# Patient Record
Sex: Female | Born: 1935 | Race: Black or African American | Hispanic: No | Smoking: Never smoker
Health system: Southern US, Community
[De-identification: ages and names within clinical notes are randomized; demographics above are authoritative.]

## PROBLEM LIST (undated history)

## (undated) DIAGNOSIS — K922 Gastrointestinal hemorrhage, unspecified: Secondary | ICD-10-CM

## (undated) DIAGNOSIS — C50919 Malignant neoplasm of unspecified site of unspecified female breast: Secondary | ICD-10-CM

## (undated) DIAGNOSIS — I10 Essential (primary) hypertension: Secondary | ICD-10-CM

## (undated) DIAGNOSIS — E119 Type 2 diabetes mellitus without complications: Secondary | ICD-10-CM

## (undated) DIAGNOSIS — D649 Anemia, unspecified: Secondary | ICD-10-CM

## (undated) HISTORY — PX: SHOULDER OPEN ROTATOR CUFF REPAIR: SHX2407

## (undated) HISTORY — PX: BREAST LUMPECTOMY: SHX2

---

## 1997-08-06 ENCOUNTER — Ambulatory Visit (HOSPITAL_COMMUNITY): Admission: RE | Admit: 1997-08-06 | Discharge: 1997-08-06 | Payer: Self-pay | Admitting: Hematology and Oncology

## 1998-03-02 ENCOUNTER — Ambulatory Visit (HOSPITAL_COMMUNITY): Admission: RE | Admit: 1998-03-02 | Discharge: 1998-03-02 | Payer: Self-pay | Admitting: Hematology and Oncology

## 1998-10-29 ENCOUNTER — Encounter: Payer: Self-pay | Admitting: Hematology and Oncology

## 1998-10-29 ENCOUNTER — Ambulatory Visit (HOSPITAL_COMMUNITY): Admission: RE | Admit: 1998-10-29 | Discharge: 1998-10-29 | Payer: Self-pay | Admitting: Hematology and Oncology

## 1998-11-02 ENCOUNTER — Ambulatory Visit: Admission: RE | Admit: 1998-11-02 | Discharge: 1998-11-02 | Payer: Self-pay | Admitting: Gynecology

## 1998-12-20 ENCOUNTER — Encounter: Payer: Self-pay | Admitting: Gynecology

## 1998-12-22 ENCOUNTER — Ambulatory Visit (HOSPITAL_COMMUNITY): Admission: RE | Admit: 1998-12-22 | Discharge: 1998-12-22 | Payer: Self-pay | Admitting: Gynecology

## 1998-12-22 ENCOUNTER — Encounter (INDEPENDENT_AMBULATORY_CARE_PROVIDER_SITE_OTHER): Payer: Self-pay | Admitting: Specialist

## 1999-01-25 ENCOUNTER — Ambulatory Visit: Admission: RE | Admit: 1999-01-25 | Discharge: 1999-01-25 | Payer: Self-pay | Admitting: Gynecology

## 1999-05-24 ENCOUNTER — Ambulatory Visit (HOSPITAL_COMMUNITY): Admission: RE | Admit: 1999-05-24 | Discharge: 1999-05-24 | Payer: Self-pay | Admitting: Hematology and Oncology

## 1999-05-24 ENCOUNTER — Encounter: Payer: Self-pay | Admitting: Hematology and Oncology

## 1999-10-19 ENCOUNTER — Encounter: Payer: Self-pay | Admitting: *Deleted

## 1999-10-19 ENCOUNTER — Inpatient Hospital Stay (HOSPITAL_COMMUNITY): Admission: EM | Admit: 1999-10-19 | Discharge: 1999-10-24 | Payer: Self-pay | Admitting: *Deleted

## 1999-10-20 ENCOUNTER — Encounter: Payer: Self-pay | Admitting: *Deleted

## 1999-10-21 ENCOUNTER — Encounter: Payer: Self-pay | Admitting: *Deleted

## 2000-05-31 ENCOUNTER — Encounter: Payer: Self-pay | Admitting: Oncology

## 2000-05-31 ENCOUNTER — Encounter: Admission: RE | Admit: 2000-05-31 | Discharge: 2000-05-31 | Payer: Self-pay | Admitting: Oncology

## 2000-11-27 ENCOUNTER — Other Ambulatory Visit: Admission: RE | Admit: 2000-11-27 | Discharge: 2000-11-27 | Payer: Self-pay | Admitting: *Deleted

## 2000-12-04 ENCOUNTER — Encounter: Admission: RE | Admit: 2000-12-04 | Discharge: 2000-12-04 | Payer: Self-pay | Admitting: Oncology

## 2000-12-04 ENCOUNTER — Encounter: Payer: Self-pay | Admitting: Oncology

## 2000-12-28 ENCOUNTER — Ambulatory Visit (HOSPITAL_COMMUNITY): Admission: RE | Admit: 2000-12-28 | Discharge: 2000-12-28 | Payer: Self-pay | Admitting: *Deleted

## 2001-07-04 ENCOUNTER — Encounter: Payer: Self-pay | Admitting: Oncology

## 2001-07-04 ENCOUNTER — Encounter: Admission: RE | Admit: 2001-07-04 | Discharge: 2001-07-04 | Payer: Self-pay | Admitting: Oncology

## 2001-10-15 ENCOUNTER — Inpatient Hospital Stay (HOSPITAL_COMMUNITY): Admission: EM | Admit: 2001-10-15 | Discharge: 2001-10-21 | Payer: Self-pay | Admitting: Emergency Medicine

## 2001-10-15 ENCOUNTER — Encounter: Payer: Self-pay | Admitting: Emergency Medicine

## 2002-07-10 ENCOUNTER — Encounter: Admission: RE | Admit: 2002-07-10 | Discharge: 2002-07-10 | Payer: Self-pay | Admitting: Oncology

## 2002-07-10 ENCOUNTER — Encounter: Payer: Self-pay | Admitting: Oncology

## 2003-02-25 ENCOUNTER — Ambulatory Visit (HOSPITAL_COMMUNITY): Admission: RE | Admit: 2003-02-25 | Discharge: 2003-02-25 | Payer: Self-pay | Admitting: Family Medicine

## 2003-02-25 ENCOUNTER — Encounter: Payer: Self-pay | Admitting: Cardiology

## 2003-07-06 ENCOUNTER — Encounter (HOSPITAL_COMMUNITY): Admission: RE | Admit: 2003-07-06 | Discharge: 2003-10-04 | Payer: Self-pay | Admitting: *Deleted

## 2005-11-27 ENCOUNTER — Encounter: Payer: Self-pay | Admitting: Vascular Surgery

## 2005-11-27 ENCOUNTER — Ambulatory Visit (HOSPITAL_COMMUNITY): Admission: RE | Admit: 2005-11-27 | Discharge: 2005-11-27 | Payer: Self-pay | Admitting: Family Medicine

## 2006-03-09 ENCOUNTER — Inpatient Hospital Stay (HOSPITAL_COMMUNITY): Admission: AD | Admit: 2006-03-09 | Discharge: 2006-03-15 | Payer: Self-pay | Admitting: Internal Medicine

## 2006-03-13 ENCOUNTER — Encounter (INDEPENDENT_AMBULATORY_CARE_PROVIDER_SITE_OTHER): Payer: Self-pay | Admitting: Cardiology

## 2006-07-25 ENCOUNTER — Encounter: Admission: RE | Admit: 2006-07-25 | Discharge: 2006-07-25 | Payer: Self-pay | Admitting: Family Medicine

## 2006-08-10 ENCOUNTER — Encounter: Admission: RE | Admit: 2006-08-10 | Discharge: 2006-08-10 | Payer: Self-pay | Admitting: Orthopedic Surgery

## 2006-08-31 ENCOUNTER — Ambulatory Visit (HOSPITAL_COMMUNITY): Admission: RE | Admit: 2006-08-31 | Discharge: 2006-09-01 | Payer: Self-pay | Admitting: Orthopedic Surgery

## 2006-10-12 ENCOUNTER — Encounter: Admission: RE | Admit: 2006-10-12 | Discharge: 2006-12-04 | Payer: Self-pay | Admitting: Orthopedic Surgery

## 2006-11-09 ENCOUNTER — Encounter (HOSPITAL_COMMUNITY): Admission: RE | Admit: 2006-11-09 | Discharge: 2007-01-23 | Payer: Self-pay | Admitting: Gastroenterology

## 2008-07-20 ENCOUNTER — Encounter: Admission: RE | Admit: 2008-07-20 | Discharge: 2008-07-20 | Payer: Self-pay | Admitting: Orthopedic Surgery

## 2008-08-07 ENCOUNTER — Inpatient Hospital Stay (HOSPITAL_COMMUNITY): Admission: EM | Admit: 2008-08-07 | Discharge: 2008-08-09 | Payer: Self-pay | Admitting: Emergency Medicine

## 2008-10-08 ENCOUNTER — Inpatient Hospital Stay (HOSPITAL_COMMUNITY): Admission: RE | Admit: 2008-10-08 | Discharge: 2008-10-13 | Payer: Self-pay | Admitting: Orthopedic Surgery

## 2008-11-23 ENCOUNTER — Ambulatory Visit: Payer: Self-pay | Admitting: Vascular Surgery

## 2008-11-23 ENCOUNTER — Ambulatory Visit (HOSPITAL_COMMUNITY): Admission: RE | Admit: 2008-11-23 | Discharge: 2008-11-23 | Payer: Self-pay | Admitting: Family Medicine

## 2008-11-23 ENCOUNTER — Emergency Department (HOSPITAL_COMMUNITY): Admission: EM | Admit: 2008-11-23 | Discharge: 2008-11-23 | Payer: Self-pay | Admitting: Family Medicine

## 2008-11-23 ENCOUNTER — Encounter (HOSPITAL_COMMUNITY): Payer: Self-pay | Admitting: Family Medicine

## 2008-12-03 ENCOUNTER — Encounter: Admission: RE | Admit: 2008-12-03 | Discharge: 2009-01-20 | Payer: Self-pay | Admitting: Orthopedic Surgery

## 2009-05-22 HISTORY — PX: CHOLECYSTECTOMY: SHX55

## 2009-08-31 ENCOUNTER — Encounter (HOSPITAL_COMMUNITY): Admission: RE | Admit: 2009-08-31 | Discharge: 2009-11-29 | Payer: Self-pay | Admitting: Nephrology

## 2009-12-21 ENCOUNTER — Ambulatory Visit: Payer: Self-pay | Admitting: Oncology

## 2009-12-23 LAB — CBC WITH DIFFERENTIAL/PLATELET
Eosinophils Absolute: 0.1 10*3/uL (ref 0.0–0.5)
HGB: 12.1 g/dL (ref 11.6–15.9)
LYMPH%: 16.4 % (ref 14.0–49.7)
MONO#: 0.4 10*3/uL (ref 0.1–0.9)
NEUT#: 3.1 10*3/uL (ref 1.5–6.5)
Platelets: 629 10*3/uL — ABNORMAL HIGH (ref 145–400)
RBC: 4.88 10*6/uL (ref 3.70–5.45)
RDW: 19.2 % — ABNORMAL HIGH (ref 11.2–14.5)
WBC: 4.3 10*3/uL (ref 3.9–10.3)
nRBC: 0 % (ref 0–0)

## 2009-12-23 LAB — COMPREHENSIVE METABOLIC PANEL
ALT: <8 U/L (ref 0–35)
CO2: 29 mEq/L (ref 19–32)
Calcium: 9.8 mg/dL (ref 8.4–10.5)
Chloride: 100 mEq/L (ref 96–112)
Sodium: 140 mEq/L (ref 135–145)
Total Protein: 7.2 g/dL (ref 6.0–8.3)

## 2009-12-23 LAB — FERRITIN: Ferritin: 49 ng/mL (ref 10–291)

## 2010-03-01 ENCOUNTER — Ambulatory Visit: Payer: Self-pay | Admitting: Oncology

## 2010-06-01 ENCOUNTER — Ambulatory Visit: Payer: Self-pay | Admitting: Oncology

## 2010-06-03 LAB — CBC WITH DIFFERENTIAL/PLATELET
BASO%: 1 % (ref 0.0–2.0)
Basophils Absolute: 0.1 10*3/uL (ref 0.0–0.1)
EOS%: 2.5 % (ref 0.0–7.0)
Eosinophils Absolute: 0.2 10*3/uL (ref 0.0–0.5)
HCT: 36.6 % (ref 34.8–46.6)
HGB: 11.9 g/dL (ref 11.6–15.9)
LYMPH%: 10.7 % — ABNORMAL LOW (ref 14.0–49.7)
MCH: 25.7 pg (ref 25.1–34.0)
MCHC: 32.5 g/dL (ref 31.5–36.0)
MCV: 79.1 fL — ABNORMAL LOW (ref 79.5–101.0)
MONO#: 0.5 10*3/uL (ref 0.1–0.9)
MONO%: 7.3 % (ref 0.0–14.0)
NEUT#: 5.1 10*3/uL (ref 1.5–6.5)
NEUT%: 78.5 % — ABNORMAL HIGH (ref 38.4–76.8)
Platelets: 917 10*3/uL — ABNORMAL HIGH (ref 145–400)
RBC: 4.63 10*6/uL (ref 3.70–5.45)
RDW: 15.1 % — ABNORMAL HIGH (ref 11.2–14.5)
WBC: 6.5 10*3/uL (ref 3.9–10.3)
lymph#: 0.7 10*3/uL — ABNORMAL LOW (ref 0.9–3.3)

## 2010-06-03 LAB — IRON AND TIBC
%SAT: 8 % — ABNORMAL LOW (ref 20–55)
Iron: 23 ug/dL — ABNORMAL LOW (ref 42–145)
TIBC: 298 ug/dL (ref 250–470)
UIBC: 275 ug/dL

## 2010-06-03 LAB — FERRITIN: Ferritin: 26 ng/mL (ref 10–291)

## 2010-06-17 LAB — CBC WITH DIFFERENTIAL/PLATELET
Basophils Absolute: 0.1 10*3/uL (ref 0.0–0.1)
Eosinophils Absolute: 0.1 10*3/uL (ref 0.0–0.5)
HCT: 36.8 % (ref 34.8–46.6)
HGB: 11.9 g/dL (ref 11.6–15.9)
MONO#: 0.3 10*3/uL (ref 0.1–0.9)
NEUT#: 3.7 10*3/uL (ref 1.5–6.5)
NEUT%: 72.4 % (ref 38.4–76.8)
RDW: 16.4 % — ABNORMAL HIGH (ref 11.2–14.5)
WBC: 5.1 10*3/uL (ref 3.9–10.3)
lymph#: 1 10*3/uL (ref 0.9–3.3)

## 2010-06-25 ENCOUNTER — Emergency Department (HOSPITAL_COMMUNITY)
Admission: EM | Admit: 2010-06-25 | Discharge: 2010-06-25 | Disposition: A | Discharge status: Other Acute Inpatient Hospital | Payer: PRIVATE HEALTH INSURANCE | Attending: Emergency Medicine | Admitting: Emergency Medicine

## 2010-06-25 ENCOUNTER — Emergency Department (HOSPITAL_COMMUNITY): Payer: PRIVATE HEALTH INSURANCE

## 2010-06-25 DIAGNOSIS — R000 Tachycardia, unspecified: Secondary | ICD-10-CM | POA: Insufficient documentation

## 2010-06-25 DIAGNOSIS — N19 Unspecified kidney failure: Secondary | ICD-10-CM | POA: Insufficient documentation

## 2010-06-25 DIAGNOSIS — Z853 Personal history of malignant neoplasm of breast: Secondary | ICD-10-CM | POA: Insufficient documentation

## 2010-06-25 DIAGNOSIS — I10 Essential (primary) hypertension: Secondary | ICD-10-CM | POA: Insufficient documentation

## 2010-06-25 DIAGNOSIS — E860 Dehydration: Secondary | ICD-10-CM | POA: Insufficient documentation

## 2010-06-25 LAB — POCT I-STAT, CHEM 8
BUN: 39 mg/dL — ABNORMAL HIGH (ref 6–23)
Creatinine, Ser: 3.7 mg/dL — ABNORMAL HIGH (ref 0.4–1.2)
Glucose, Bld: 157 mg/dL — ABNORMAL HIGH (ref 70–99)
Hemoglobin: 12.6 g/dL (ref 12.0–15.0)
Potassium: 3.4 mEq/L — ABNORMAL LOW (ref 3.5–5.1)
Sodium: 132 mEq/L — ABNORMAL LOW (ref 135–145)
TCO2: 25 mmol/L (ref 0–100)

## 2010-06-25 LAB — DIFFERENTIAL
Basophils Relative: 0 % (ref 0–1)
Lymphocytes Relative: 3 % — ABNORMAL LOW (ref 12–46)
Monocytes Absolute: 0.4 10*3/uL (ref 0.1–1.0)
Monocytes Relative: 4 % (ref 3–12)
Neutro Abs: 8.3 10*3/uL — ABNORMAL HIGH (ref 1.7–7.7)
Neutrophils Relative %: 93 % — ABNORMAL HIGH (ref 43–77)

## 2010-06-25 LAB — CBC
HCT: 34.9 % — ABNORMAL LOW (ref 36.0–46.0)
Hemoglobin: 11.9 g/dL — ABNORMAL LOW (ref 12.0–15.0)
MCH: 26.3 pg (ref 26.0–34.0)
MCHC: 34.1 g/dL (ref 30.0–36.0)
RBC: 4.53 MIL/uL (ref 3.87–5.11)

## 2010-06-25 LAB — POCT CARDIAC MARKERS
CKMB, poc: <1 ng/mL — ABNORMAL LOW (ref 1.0–8.0)
Myoglobin, poc: >500 ng/mL (ref 12–200)

## 2010-06-25 LAB — URINALYSIS, ROUTINE W REFLEX MICROSCOPIC
Leukocytes, UA: NEGATIVE
Nitrite: NEGATIVE
Protein, ur: NEGATIVE mg/dL
Specific Gravity, Urine: 1.015 (ref 1.005–1.030)
Urobilinogen, UA: 0.2 mg/dL (ref 0.0–1.0)

## 2010-06-25 LAB — URINE MICROSCOPIC-ADD ON

## 2010-06-25 LAB — HEPATIC FUNCTION PANEL
Bilirubin, Direct: 0.1 mg/dL (ref 0.0–0.3)
Indirect Bilirubin: 0.6 mg/dL (ref 0.3–0.9)
Total Bilirubin: 0.7 mg/dL (ref 0.3–1.2)

## 2010-06-25 LAB — CK TOTAL AND CKMB (NOT AT ARMC)
CK, MB: 1.3 ng/mL (ref 0.3–4.0)
Total CK: 194 U/L — ABNORMAL HIGH (ref 7–177)

## 2010-06-25 LAB — LIPASE, BLOOD: Lipase: 64 U/L — ABNORMAL HIGH (ref 11–59)

## 2010-06-26 ENCOUNTER — Inpatient Hospital Stay (HOSPITAL_COMMUNITY): Payer: PRIVATE HEALTH INSURANCE

## 2010-06-26 ENCOUNTER — Other Ambulatory Visit: Payer: Self-pay | Admitting: Internal Medicine

## 2010-06-26 ENCOUNTER — Inpatient Hospital Stay (HOSPITAL_COMMUNITY)
Admission: EM | Admit: 2010-06-26 | Discharge: 2010-06-28 | DRG: 392 | Disposition: A | Discharge status: Nursing Home (Includes ICF) | Payer: PRIVATE HEALTH INSURANCE | Source: Ambulatory Visit | Attending: Internal Medicine | Admitting: Internal Medicine

## 2010-06-26 DIAGNOSIS — K8020 Calculus of gallbladder without cholecystitis without obstruction: Secondary | ICD-10-CM | POA: Diagnosis present

## 2010-06-26 DIAGNOSIS — K560 Paralytic ileus: Secondary | ICD-10-CM | POA: Diagnosis not present

## 2010-06-26 DIAGNOSIS — I10 Essential (primary) hypertension: Secondary | ICD-10-CM | POA: Diagnosis present

## 2010-06-26 DIAGNOSIS — E860 Dehydration: Secondary | ICD-10-CM | POA: Diagnosis present

## 2010-06-26 DIAGNOSIS — E119 Type 2 diabetes mellitus without complications: Secondary | ICD-10-CM | POA: Diagnosis present

## 2010-06-26 DIAGNOSIS — A09 Infectious gastroenteritis and colitis, unspecified: Principal | ICD-10-CM | POA: Diagnosis present

## 2010-06-26 DIAGNOSIS — N179 Acute kidney failure, unspecified: Secondary | ICD-10-CM | POA: Diagnosis present

## 2010-06-26 LAB — DIFFERENTIAL
Lymphs Abs: 0.2 10*3/uL — ABNORMAL LOW (ref 0.7–4.0)
Monocytes Relative: 6 % (ref 3–12)
Neutro Abs: 4.9 10*3/uL (ref 1.7–7.7)
Neutrophils Relative %: 90 % — ABNORMAL HIGH (ref 43–77)

## 2010-06-26 LAB — COMPREHENSIVE METABOLIC PANEL
ALT: 13 U/L (ref 0–35)
Alkaline Phosphatase: 35 U/L — ABNORMAL LOW (ref 39–117)
CO2: 24 mEq/L (ref 19–32)
GFR calc non Af Amer: 22 mL/min — ABNORMAL LOW (ref >60)
Glucose, Bld: 92 mg/dL (ref 70–99)
Potassium: 3.7 mEq/L (ref 3.5–5.1)
Sodium: 138 mEq/L (ref 135–145)
Total Protein: 6 g/dL (ref 6.0–8.3)

## 2010-06-26 LAB — CBC
Hemoglobin: 10.3 g/dL — ABNORMAL LOW (ref 12.0–15.0)
MCH: 25.1 pg — ABNORMAL LOW (ref 26.0–34.0)
MCV: 79 fL (ref 78.0–100.0)
RBC: 4.1 MIL/uL (ref 3.87–5.11)

## 2010-06-26 LAB — LIPASE, BLOOD: Lipase: 74 U/L — ABNORMAL HIGH (ref 11–59)

## 2010-06-26 LAB — HEMOGLOBIN A1C: Mean Plasma Glucose: 114 mg/dL (ref <117)

## 2010-06-26 LAB — GLUCOSE, CAPILLARY: Glucose-Capillary: 155 mg/dL — ABNORMAL HIGH (ref 70–99)

## 2010-06-27 LAB — BASIC METABOLIC PANEL
BUN: 28 mg/dL — ABNORMAL HIGH (ref 6–23)
Calcium: 8.2 mg/dL — ABNORMAL LOW (ref 8.4–10.5)
Creatinine, Ser: 1.58 mg/dL — ABNORMAL HIGH (ref 0.4–1.2)
GFR calc non Af Amer: 31 mL/min — ABNORMAL LOW (ref >60)
Potassium: 3.8 mEq/L (ref 3.5–5.1)

## 2010-06-27 LAB — GLUCOSE, CAPILLARY
Glucose-Capillary: 105 mg/dL — ABNORMAL HIGH (ref 70–99)
Glucose-Capillary: 62 mg/dL — ABNORMAL LOW (ref 70–99)

## 2010-06-27 LAB — SODIUM, URINE, RANDOM: Sodium, Ur: 46 mEq/L

## 2010-06-27 LAB — CREATININE, URINE, RANDOM: Creatinine, Urine: 97 mg/dL

## 2010-06-28 LAB — BASIC METABOLIC PANEL
Calcium: 8.5 mg/dL (ref 8.4–10.5)
GFR calc non Af Amer: 41 mL/min — ABNORMAL LOW (ref >60)
Glucose, Bld: 96 mg/dL (ref 70–99)
Sodium: 140 mEq/L (ref 135–145)

## 2010-06-28 LAB — GLUCOSE, CAPILLARY: Glucose-Capillary: 95 mg/dL (ref 70–99)

## 2010-06-29 ENCOUNTER — Emergency Department (HOSPITAL_COMMUNITY)
Admission: EM | Admit: 2010-06-29 | Discharge: 2010-06-29 | Disposition: A | Discharge status: Home / Self Care | Payer: PRIVATE HEALTH INSURANCE | Attending: Emergency Medicine | Admitting: Emergency Medicine

## 2010-06-29 ENCOUNTER — Emergency Department (HOSPITAL_COMMUNITY): Payer: PRIVATE HEALTH INSURANCE

## 2010-06-29 DIAGNOSIS — I10 Essential (primary) hypertension: Secondary | ICD-10-CM | POA: Insufficient documentation

## 2010-06-29 DIAGNOSIS — E119 Type 2 diabetes mellitus without complications: Secondary | ICD-10-CM | POA: Insufficient documentation

## 2010-06-29 DIAGNOSIS — L02619 Cutaneous abscess of unspecified foot: Secondary | ICD-10-CM | POA: Insufficient documentation

## 2010-06-29 DIAGNOSIS — M79609 Pain in unspecified limb: Secondary | ICD-10-CM | POA: Insufficient documentation

## 2010-06-29 LAB — GLUCOSE, CAPILLARY: Glucose-Capillary: 93 mg/dL (ref 70–99)

## 2010-07-02 LAB — CULTURE, BLOOD (ROUTINE X 2)
Culture  Setup Time: 201202051850
Culture  Setup Time: 201202051850
Culture: NO GROWTH

## 2010-07-03 NOTE — Discharge Summary (Signed)
[name], [name]            ACCOUNT NO.:  [id number]  MEDICAL RECORD NO.:  [id number]           PATIENT TYPE:  LOCATION:                                 FACILITY:  PHYSICIAN:  [name], MDDATE OF BIRTH:  02/11/1921  DATE OF ADMISSION: DATE OF DISCHARGE:                        DISCHARGE SUMMARY - REFERRING   PRIMARY CARE PHYSICIAN:  Monique Rakers, MD  CHIEF COMPLAINT:  Nausea, vomiting, fever, and diarrhea.  DISCHARGE DIAGNOSES: 1. Acute diarrhea, possibly of infectious origin - with mucus and     semisolid in nature with smell - currently resolved. 2. Nausea and vomiting, resolved. 3. Acute kidney injury, possibly secondary to:     a.     Aggressive diuretic therapy.     b.     Intake of ARB.     c.     Dehydration with addition of aggressive diuretic therapy and      intake of ARB leading to acute renal failure - currently      resolving. 4. Diabetes mellitus.  Blood sugar is very stable during hospital     stay. 5. Previous history of breast cancer 10 years ago. 6. Hypertension.  DISCHARGE MEDICATIONS: 1. Ciprofloxacin 250 mg p.o. b.i.d. for 4 more days. 2. Januvia 50 mg p.o. daily, has dose changed from 100-50. 3. Over-the-counter iron tablets 1 tablet p.o. daily.  The following medication has been discontinued; Lasix, Benicar, and hydroxyurea.  The prescription for hydroxyurea is pretty questionable and needs to be reverified with pharmacy.  This information was obtained from the patient's pharmacy.  HOSPITAL COURSE: 1. GI - the patient came in with significant weakness and fever     associated with nausea and vomiting.  On evaluation and family     history taken, the patient was found to have diarrhea, which was     not watery, but semisolid, but had a foul smell.  She was started     empirically on ciprofloxacin after that.  She did not have any     significant diarrhea after that.  She does not give any history of     p.o. antibiotic intake  prior to this and so a C. diff testing is     not warranted.  Also, the diarrhea has resolved without any other     antibiotic other than ciprofloxacin, which was started with a     tentative thought of possible bacterial diarrhea.  Since she     responded very well to that, we would continue that for another 4     days and discontinue the antibiotics. 2. Renal.  The patient was on Lasix and on Benicar at the time of     admission and she was very dehydrated.  The fractional excretion of     sodium was 0.6 - less than 1.0, which indicates significant     prerenal origin of the issue.  Her baseline creatinine is around     1.1 to 1.2.  On the time of admission, her BUN was 39 and     creatinine was 3.7.  She was aggressively hydrated and currently     the  last BUN and creatinine is 17/1.25, which is almost close to     baseline.  Once she has reached the baseline and if her blood     pressures are still on the higher side, I would restart the     Benicar.  Currently, her blood pressures are pretty well within     normal limits to ranging from 125 to 107 systolic and diastolic     ranging from 78-55 and heart rate is also well controlled.     Considering the fact that she is a diabetic, an ARB is decided but     the reduction in the dose of the Benicar will be very much needed     considering the fact that her blood pressure is pretty within     normal limits at this time even without medications. 3. Diabetes mellitus.  Her blood sugars are pretty stable, initially     shows n.p.o. and at that time her blood sugars dropped.  She was on     100 mg of Januvia, which had changed to 50 mg at this time.  She     did have 1 or 2 episodes of low blood sugars because she was n.p.o.     Currently, she is back to her regular diet and her sugars have been     stable.  The last set of sugars we have from are about 95, 120, 77,     and 103.  This was when the patient is not on any antidiabetic      medications.  DISPOSITION:  The patient is discharged back home to an assisted senior living facility.  A followup with Dr. Renaye [name], has been arranged for July 01, 2010, at 10:45 a.m.  The patient needs to have a BMP/BMET at the same time and the results will be followed by her primary physician.  As noted above, the last BUN and creatinine is 17/1.25.  INSTRUCTIONS: 1. The patient has been educated about adequate hydration, but not to     overdo it. 2. Diabetic diet advised. 3. Fall precautions advised.  A total of 45 minutes spent on the discharge process.     Monique Massed, MD     UT/MEDQ  D:  06/28/2010  T:  06/28/2010  Job:  [id number]  cc:   [name], M.D.  Electronically Signed by Monique Massed MD on 07/03/2010 09:10:25 AM

## 2010-07-04 ENCOUNTER — Emergency Department (HOSPITAL_COMMUNITY): Payer: PRIVATE HEALTH INSURANCE

## 2010-07-04 ENCOUNTER — Inpatient Hospital Stay (HOSPITAL_COMMUNITY)
Admission: EM | Admit: 2010-07-04 | Discharge: 2010-07-14 | DRG: 417 | Disposition: A | Discharge status: Skilled Nursing Facility | Payer: PRIVATE HEALTH INSURANCE | Attending: Internal Medicine | Admitting: Internal Medicine

## 2010-07-04 DIAGNOSIS — Z794 Long term (current) use of insulin: Secondary | ICD-10-CM

## 2010-07-04 DIAGNOSIS — D62 Acute posthemorrhagic anemia: Secondary | ICD-10-CM | POA: Diagnosis present

## 2010-07-04 DIAGNOSIS — N179 Acute kidney failure, unspecified: Secondary | ICD-10-CM | POA: Diagnosis present

## 2010-07-04 DIAGNOSIS — Z853 Personal history of malignant neoplasm of breast: Secondary | ICD-10-CM

## 2010-07-04 DIAGNOSIS — I10 Essential (primary) hypertension: Secondary | ICD-10-CM | POA: Diagnosis present

## 2010-07-04 DIAGNOSIS — E785 Hyperlipidemia, unspecified: Secondary | ICD-10-CM | POA: Diagnosis present

## 2010-07-04 DIAGNOSIS — E119 Type 2 diabetes mellitus without complications: Secondary | ICD-10-CM | POA: Diagnosis present

## 2010-07-04 DIAGNOSIS — Q391 Atresia of esophagus with tracheo-esophageal fistula: Secondary | ICD-10-CM

## 2010-07-04 DIAGNOSIS — K31819 Angiodysplasia of stomach and duodenum without bleeding: Secondary | ICD-10-CM | POA: Diagnosis present

## 2010-07-04 DIAGNOSIS — J189 Pneumonia, unspecified organism: Secondary | ICD-10-CM | POA: Diagnosis not present

## 2010-07-04 DIAGNOSIS — Z87891 Personal history of nicotine dependence: Secondary | ICD-10-CM

## 2010-07-04 DIAGNOSIS — K219 Gastro-esophageal reflux disease without esophagitis: Secondary | ICD-10-CM | POA: Diagnosis present

## 2010-07-04 DIAGNOSIS — R5381 Other malaise: Secondary | ICD-10-CM | POA: Diagnosis not present

## 2010-07-04 DIAGNOSIS — K859 Acute pancreatitis without necrosis or infection, unspecified: Principal | ICD-10-CM | POA: Diagnosis present

## 2010-07-04 DIAGNOSIS — Z7982 Long term (current) use of aspirin: Secondary | ICD-10-CM

## 2010-07-04 DIAGNOSIS — K921 Melena: Secondary | ICD-10-CM | POA: Diagnosis present

## 2010-07-04 DIAGNOSIS — K8010 Calculus of gallbladder with chronic cholecystitis without obstruction: Secondary | ICD-10-CM | POA: Diagnosis present

## 2010-07-04 DIAGNOSIS — Z96659 Presence of unspecified artificial knee joint: Secondary | ICD-10-CM

## 2010-07-04 DIAGNOSIS — Z9010 Acquired absence of unspecified breast and nipple: Secondary | ICD-10-CM

## 2010-07-04 LAB — DIFFERENTIAL
Basophils Absolute: 0 10*3/uL (ref 0.0–0.1)
Eosinophils Relative: 1 % (ref 0–5)
Lymphocytes Relative: 3 % — ABNORMAL LOW (ref 12–46)
Lymphs Abs: 0.1 10*3/uL — ABNORMAL LOW (ref 0.7–4.0)
Monocytes Absolute: 0.1 10*3/uL (ref 0.1–1.0)

## 2010-07-04 LAB — CBC
HCT: 30.2 % — ABNORMAL LOW (ref 36.0–46.0)
MCHC: 33.8 g/dL (ref 30.0–36.0)
MCV: 74.6 fL — ABNORMAL LOW (ref 78.0–100.0)
RDW: 17.5 % — ABNORMAL HIGH (ref 11.5–15.5)

## 2010-07-04 LAB — BASIC METABOLIC PANEL
BUN: 25 mg/dL — ABNORMAL HIGH (ref 6–23)
Calcium: 8.7 mg/dL (ref 8.4–10.5)
Creatinine, Ser: 1.9 mg/dL — ABNORMAL HIGH (ref 0.4–1.2)
GFR calc non Af Amer: 25 mL/min — ABNORMAL LOW (ref >60)
Glucose, Bld: 121 mg/dL — ABNORMAL HIGH (ref 70–99)

## 2010-07-04 LAB — URINE MICROSCOPIC-ADD ON

## 2010-07-04 LAB — URINALYSIS, ROUTINE W REFLEX MICROSCOPIC
Bilirubin Urine: NEGATIVE
Ketones, ur: NEGATIVE mg/dL
Nitrite: NEGATIVE
Urobilinogen, UA: 1 mg/dL (ref 0.0–1.0)

## 2010-07-04 LAB — HEPATIC FUNCTION PANEL
AST: 74 U/L — ABNORMAL HIGH (ref 0–37)
Albumin: 3 g/dL — ABNORMAL LOW (ref 3.5–5.2)
Alkaline Phosphatase: 125 U/L — ABNORMAL HIGH (ref 39–117)
Total Bilirubin: 0.5 mg/dL (ref 0.3–1.2)

## 2010-07-05 ENCOUNTER — Inpatient Hospital Stay (HOSPITAL_COMMUNITY): Payer: PRIVATE HEALTH INSURANCE

## 2010-07-05 DIAGNOSIS — M79609 Pain in unspecified limb: Secondary | ICD-10-CM

## 2010-07-05 LAB — GLUCOSE, CAPILLARY
Glucose-Capillary: 128 mg/dL — ABNORMAL HIGH (ref 70–99)
Glucose-Capillary: 121 mg/dL — ABNORMAL HIGH (ref 70–99)
Glucose-Capillary: 98 mg/dL (ref 70–99)

## 2010-07-05 LAB — COMPREHENSIVE METABOLIC PANEL
ALT: 30 U/L (ref 0–35)
AST: 50 U/L — ABNORMAL HIGH (ref 0–37)
Alkaline Phosphatase: 108 U/L (ref 39–117)
BUN: 24 mg/dL — ABNORMAL HIGH (ref 6–23)
CO2: 20 mEq/L (ref 19–32)
CO2: 24 mEq/L (ref 19–32)
Chloride: 112 mEq/L (ref 96–112)
Chloride: 105 mEq/L (ref 96–112)
Creatinine, Ser: 1.75 mg/dL — ABNORMAL HIGH (ref 0.4–1.2)
Creatinine, Ser: 2.08 mg/dL — ABNORMAL HIGH (ref 0.4–1.2)
GFR calc Af Amer: 33 mL/min — ABNORMAL LOW (ref >60)
GFR calc non Af Amer: 27 mL/min — ABNORMAL LOW (ref >60)
GFR calc non Af Amer: 23 mL/min — ABNORMAL LOW (ref >60)
Glucose, Bld: 114 mg/dL — ABNORMAL HIGH (ref 70–99)
Total Bilirubin: 1.5 mg/dL — ABNORMAL HIGH (ref 0.3–1.2)
Total Bilirubin: 0.6 mg/dL (ref 0.3–1.2)

## 2010-07-05 LAB — CBC
Hemoglobin: 7.3 g/dL — ABNORMAL LOW (ref 12.0–15.0)
MCH: 24.9 pg — ABNORMAL LOW (ref 26.0–34.0)
RBC: 2.93 MIL/uL — ABNORMAL LOW (ref 3.87–5.11)

## 2010-07-06 LAB — GLUCOSE, CAPILLARY
Glucose-Capillary: 103 mg/dL — ABNORMAL HIGH (ref 70–99)
Glucose-Capillary: 117 mg/dL — ABNORMAL HIGH (ref 70–99)

## 2010-07-06 LAB — COMPREHENSIVE METABOLIC PANEL
AST: 55 U/L — ABNORMAL HIGH (ref 0–37)
BUN: 19 mg/dL (ref 6–23)
CO2: 22 mEq/L (ref 19–32)
Chloride: 106 mEq/L (ref 96–112)
Creatinine, Ser: 1.86 mg/dL — ABNORMAL HIGH (ref 0.4–1.2)
GFR calc Af Amer: 31 mL/min — ABNORMAL LOW (ref >60)
GFR calc non Af Amer: 26 mL/min — ABNORMAL LOW (ref >60)
Glucose, Bld: 91 mg/dL (ref 70–99)
Total Bilirubin: 0.4 mg/dL (ref 0.3–1.2)

## 2010-07-06 LAB — CBC
HCT: 22.9 % — ABNORMAL LOW (ref 36.0–46.0)
Hemoglobin: 7.6 g/dL — ABNORMAL LOW (ref 12.0–15.0)
MCHC: 33.2 g/dL (ref 30.0–36.0)
MCV: 76.6 fL — ABNORMAL LOW (ref 78.0–100.0)
RDW: 18 % — ABNORMAL HIGH (ref 11.5–15.5)

## 2010-07-06 LAB — LIPASE, BLOOD: Lipase: 39 U/L (ref 11–59)

## 2010-07-06 LAB — FOLATE: Folate: 8.8 ng/mL

## 2010-07-06 LAB — IRON AND TIBC: UIBC: 157 ug/dL

## 2010-07-06 LAB — FERRITIN: Ferritin: 461 ng/mL — ABNORMAL HIGH (ref 10–291)

## 2010-07-07 LAB — GLUCOSE, CAPILLARY
Glucose-Capillary: 115 mg/dL — ABNORMAL HIGH (ref 70–99)
Glucose-Capillary: 94 mg/dL (ref 70–99)
Glucose-Capillary: 109 mg/dL — ABNORMAL HIGH (ref 70–99)

## 2010-07-07 LAB — CBC
Hemoglobin: 8.4 g/dL — ABNORMAL LOW (ref 12.0–15.0)
MCH: 24.6 pg — ABNORMAL LOW (ref 26.0–34.0)
MCV: 76.8 fL — ABNORMAL LOW (ref 78.0–100.0)
Platelets: 402 10*3/uL — ABNORMAL HIGH (ref 150–400)
RBC: 3.41 MIL/uL — ABNORMAL LOW (ref 3.87–5.11)

## 2010-07-07 LAB — COMPREHENSIVE METABOLIC PANEL
BUN: 15 mg/dL (ref 6–23)
CO2: 24 mEq/L (ref 19–32)
Chloride: 107 mEq/L (ref 96–112)
Creatinine, Ser: 1.54 mg/dL — ABNORMAL HIGH (ref 0.4–1.2)
GFR calc non Af Amer: 32 mL/min — ABNORMAL LOW (ref >60)
Total Bilirubin: 0.3 mg/dL (ref 0.3–1.2)

## 2010-07-08 LAB — GLUCOSE, CAPILLARY: Glucose-Capillary: 113 mg/dL — ABNORMAL HIGH (ref 70–99)

## 2010-07-08 LAB — HEMOCCULT GUIAC POC 1CARD (OFFICE): Fecal Occult Bld: NEGATIVE

## 2010-07-09 ENCOUNTER — Other Ambulatory Visit: Payer: Self-pay | Admitting: General Surgery

## 2010-07-09 ENCOUNTER — Inpatient Hospital Stay (HOSPITAL_COMMUNITY): Payer: PRIVATE HEALTH INSURANCE

## 2010-07-09 LAB — GLUCOSE, CAPILLARY
Glucose-Capillary: 120 mg/dL — ABNORMAL HIGH (ref 70–99)
Glucose-Capillary: 118 mg/dL — ABNORMAL HIGH (ref 70–99)
Glucose-Capillary: 138 mg/dL — ABNORMAL HIGH (ref 70–99)

## 2010-07-09 LAB — CBC
HCT: 26.6 % — ABNORMAL LOW (ref 36.0–46.0)
Hemoglobin: 8.5 g/dL — ABNORMAL LOW (ref 12.0–15.0)
MCV: 76.9 fL — ABNORMAL LOW (ref 78.0–100.0)
WBC: 3.3 10*3/uL — ABNORMAL LOW (ref 4.0–10.5)

## 2010-07-09 LAB — METHYLMALONIC ACID, SERUM: Methylmalonic Acid, Quantitative: 411 nmol/L — ABNORMAL HIGH (ref 87–318)

## 2010-07-09 LAB — BASIC METABOLIC PANEL
BUN: 3 mg/dL — ABNORMAL LOW (ref 6–23)
CO2: 25 mEq/L (ref 19–32)
Glucose, Bld: 117 mg/dL — ABNORMAL HIGH (ref 70–99)
Potassium: 3.5 mEq/L (ref 3.5–5.1)
Sodium: 141 mEq/L (ref 135–145)

## 2010-07-09 LAB — MRSA PCR SCREENING: MRSA by PCR: NEGATIVE

## 2010-07-09 LAB — TYPE AND SCREEN
ABO/RH(D): A POS
Antibody Screen: NEGATIVE

## 2010-07-10 ENCOUNTER — Inpatient Hospital Stay (HOSPITAL_COMMUNITY): Payer: PRIVATE HEALTH INSURANCE

## 2010-07-10 LAB — CBC
MCV: 77.9 fL — ABNORMAL LOW (ref 78.0–100.0)
Platelets: 458 10*3/uL — ABNORMAL HIGH (ref 150–400)
RBC: 3.58 MIL/uL — ABNORMAL LOW (ref 3.87–5.11)
RDW: 18.2 % — ABNORMAL HIGH (ref 11.5–15.5)
WBC: 10.9 10*3/uL — ABNORMAL HIGH (ref 4.0–10.5)

## 2010-07-10 LAB — COMPREHENSIVE METABOLIC PANEL
AST: 60 U/L — ABNORMAL HIGH (ref 0–37)
Albumin: 2.3 g/dL — ABNORMAL LOW (ref 3.5–5.2)
Alkaline Phosphatase: 90 U/L (ref 39–117)
BUN: 3 mg/dL — ABNORMAL LOW (ref 6–23)
GFR calc Af Amer: >60 mL/min (ref >60)
Potassium: 3.6 mEq/L (ref 3.5–5.1)
Total Protein: 5.5 g/dL — ABNORMAL LOW (ref 6.0–8.3)

## 2010-07-10 LAB — GLUCOSE, CAPILLARY
Glucose-Capillary: 139 mg/dL — ABNORMAL HIGH (ref 70–99)
Glucose-Capillary: 117 mg/dL — ABNORMAL HIGH (ref 70–99)

## 2010-07-10 NOTE — Consult Note (Signed)
[name], [name]            ACCOUNT NO.:  [id number]  MEDICAL RECORD NO.:  [id number]           PATIENT TYPE:  I  LOCATION:  4712                         FACILITY:  [facility]  PHYSICIAN:  Almond Lint, MD       DATE OF BIRTH:  02/11/1921  DATE OF CONSULTATION:  07/04/2010 DATE OF DISCHARGE:                                CONSULTATION   REQUESTING PHYSICIAN:  Zadie Rhine, MD, Emergency Department.  PRIMARY CARE PHYSICIAN:  Renaye Rakers, MD  CHIEF COMPLAINT:  Nausea, vomiting, and abdominal pain.  HISTORY OF PRESENT ILLNESS:  [name] is an 77 year old female who was admitted for a week and discharged several days ago for gastroenteritis. She was febrile with abdominal pain, nausea and vomiting.  She felt some better and went home for several days; however, today she developed even more severe abdominal pain with dry heaves.  She has not had any diarrhea since she went home.  She does complain of bilateral foot pain and abdominal pain.  She endorses fevers with here and at home.  She denies chest pain, shortness of breath.  Her review of systems is otherwise negative.  Her lipase was mildly elevated on her last admission and has remained decreased.  PAST MEDICAL HISTORY:  Significant for: 1. Hypertension. 2. Diabetes. 3. Breast cancer.  PAST SURGICAL HISTORY:  Left mastectomy and she says abdominal surgery for her tubal pregnancy.  FAMILY HISTORY:  She did have one granddaughter with cholecystectomy and another granddaughter died of an infection.  SOCIAL HISTORY:  She was brought here by granddaughter.  She does not smoke, use drugs or drink alcohol.  REVIEW OF SYSTEMS:  Positive for the bilateral foot pain, otherwise negative x11 as in the HPI.  MEDICATIONS:  Sulfa, Benicar, Januvia, hydrocodone, aspirin, Integra, hydroxyurea, and Lasix.  ALLERGIES:  None.  PHYSICAL EXAMINATION:  VITAL SIGNS:  Temperature 102.2, heart rate 105, blood pressure 118/38,  respiratory rate 28, oxygen saturation 100% on room air. GENERAL:  She is well developed and well nourished, looks uncomfortable. HEENT:  Normocephalic, atraumatic.  Sclerae are anicteric. NECK:  Supple.  No thyromegaly.  Trachea is midline. HEART:  Regular rate and rhythm.  No murmurs. LUNGS:  Clear bilaterally.  No wheezing. ABDOMEN:  Soft, slightly distended, tender in the epigastrium and periumbilical area. EXTREMITIES:  The left lateral foot erythematous and both lower extremities are warm to about the midcalf region laterally.  The thighs remain cool. NEUROLOGIC:  No gross motor or sensory deficits. PSYCHIATRY:  Mood and affect are normal.  STUDIES:  On her prior admission, she had an abdominal film with unremarkable bowel gas pattern.  A CT with gallstones and calcified uterine fibroids and hiatal hernia.  Ultrasound demonstrating cholelithiasis without evidence of cholecystitis or biliary ductal dilatation.  She had right great toe film with no bony abnormality.  LABORATORY DATA:  Sodium 132, potassium 4.3, chloride 98, CO2 21, BUN 25, creatinine 1.9, which is up slightly from the last check at 1.25 down from 3.7 from her last admission, glucose 121.  LFTs; bilirubin 0.5, AST 74, ALT 39, alk phos elevated at 125, lipase 65.  UA  hazy. White count 5.6, hemoglobin 10.2, hematocrit 30.2, and platelet count 435,000.  IMPRESSION:  [name] is an 77 year old female with probable gallstone pancreatitis versus continue gastroenteritis, acute renal insufficiency and bilateral lower extremity cellulitis, it is unclear to me what is causing her fever.  She is tender in the abdomen, but she also had frank cellulitis and warmth on both of her legs.  I suspect that with her elevated liver function test, she does certainly have an element of pancreatitis.  With her increased creatinine, I am not going to repeat a CT scan tonight, although perhaps I may end up doing that tomorrow.   I recommend hydration, bowel rest and pain control, IV antibiotics and diuresis.  If possible as her acute renal insufficiency is resolved, she may end up getting a HIDA scan; however, I suspect that our recommendation will be cholecystectomy this admission with the pancreatitis.     Almond Lint, MD     FB/MEDQ  D:  07/04/2010  T:  07/05/2010  Job:  [id number]  Electronically Signed by Almond Lint MD on 07/10/2010 12:49:34 AM

## 2010-07-11 LAB — GLUCOSE, CAPILLARY
Glucose-Capillary: 119 mg/dL — ABNORMAL HIGH (ref 70–99)
Glucose-Capillary: 89 mg/dL (ref 70–99)
Glucose-Capillary: 141 mg/dL — ABNORMAL HIGH (ref 70–99)

## 2010-07-11 LAB — CBC
Hemoglobin: 8 g/dL — ABNORMAL LOW (ref 12.0–15.0)
MCHC: 32 g/dL (ref 30.0–36.0)
RDW: 18.3 % — ABNORMAL HIGH (ref 11.5–15.5)
WBC: 7.7 10*3/uL (ref 4.0–10.5)

## 2010-07-11 LAB — BASIC METABOLIC PANEL
Calcium: 7.9 mg/dL — ABNORMAL LOW (ref 8.4–10.5)
GFR calc Af Amer: >60 mL/min (ref >60)
GFR calc non Af Amer: 58 mL/min — ABNORMAL LOW (ref >60)
Sodium: 140 mEq/L (ref 135–145)

## 2010-07-12 LAB — BASIC METABOLIC PANEL
CO2: 26 mEq/L (ref 19–32)
Chloride: 107 mEq/L (ref 96–112)
Creatinine, Ser: 0.96 mg/dL (ref 0.4–1.2)
GFR calc Af Amer: >60 mL/min (ref >60)
Glucose, Bld: 95 mg/dL (ref 70–99)

## 2010-07-12 LAB — GLUCOSE, CAPILLARY: Glucose-Capillary: 91 mg/dL (ref 70–99)

## 2010-07-12 LAB — CBC
HCT: 24.6 % — ABNORMAL LOW (ref 36.0–46.0)
Hemoglobin: 7.9 g/dL — ABNORMAL LOW (ref 12.0–15.0)
MCH: 24.9 pg — ABNORMAL LOW (ref 26.0–34.0)
MCV: 77.6 fL — ABNORMAL LOW (ref 78.0–100.0)
RBC: 3.17 MIL/uL — ABNORMAL LOW (ref 3.87–5.11)

## 2010-07-13 LAB — GLUCOSE, CAPILLARY
Glucose-Capillary: 117 mg/dL — ABNORMAL HIGH (ref 70–99)
Glucose-Capillary: 74 mg/dL (ref 70–99)
Glucose-Capillary: 89 mg/dL (ref 70–99)
Glucose-Capillary: 97 mg/dL (ref 70–99)

## 2010-07-13 NOTE — H&P (Signed)
[name], [name]            ACCOUNT NO.:  [id number]  MEDICAL RECORD NO.:  [id number]           PATIENT TYPE:  E  LOCATION:  [facility]                         FACILITY:  [facility]  PHYSICIAN:  Massie Maroon, MD        DATE OF BIRTH:  02/11/1921  DATE OF ADMISSION:  06/25/2010 DATE OF DISCHARGE:  06/25/2010                             HISTORY & PHYSICAL   PRIMARY CARE PHYSICIAN:  Renaye Rakers, M.D.  CHIEF COMPLAINT:  Nausea, vomiting, diarrhea, fever.  HISTORY OF PRESENT ILLNESS:  The patient is a 77 year old female with type 2 diabetes, hypertension, hyperlipidemia, history of breast cancer in the remote past,  apparently complains of some epigastric discomfort which she describes as a dull ache or sick to her stomach intermittently starting Thursday.  She also noted intermittent diarrhea starting on Thursday which has continued.  The patient has had some dry heaves, as well.  The patient denies any constipation, bright red blood per rectum or black stool.  She denies any recent antibiotic use.  The patient did note that she has had some slight cough with yellow sputum for the last 2 to 3 days but denies any chest pain, palpitations, shortness of breath.  The patient was brought to the ED via have daily for continued symptoms.  She was noted to be in acute renal failure with a BUN and creatinine of 39 at 3.7.  Her sodium was slightly low at 132 and her potassium was slightly low at 3.4.  The patient will be admitted for workup of fever, nausea, vomiting, diarrhea, acute renal failure, and possible mild pancreatitis.  Note, her lipase was elevated at 64, however, CT scan of the abdomen and pelvis was negative.  For pancreatitis, however, it was done without contrast.  It did show evidence of calcified gallstones within the neck of the bladder.  There was no evidence of biliary dilation.  The patient will be admitted for pancreatitis as stated above as well.  PAST MEDICAL HISTORY: 1.  Diabetes type 2. 2. Hypertension. 3. Hyperlipidemia. 4. Breast cancer diagnosed 10 years ago.  PAST SURGICAL HISTORY: 1. Right total knee arthroplasty 2. Partial mastectomy. 3. Tubal pregnancy.  SOCIAL HISTORY:  The patient does not smoke or drink.  She is a former smoker who quit 60 years ago.  She lives by herself.  She is typically independent in her activities of daily living.  FAMILY HISTORY:  Mother died age 69 of breast cancer and was a smoker. One sister died of throat cancer at age 87 and was a smoker.  Father died at age 29 of old age.  She has a daughter with diabetes.  ALLERGIES:  No known drug allergies.  MEDICATIONS:  Unknown.  PHYSICAL EXAMINATION:  VITAL SIGNS:  Temperature 104.1, pulse 104, blood pressure 146/63, pulse ox 95% on room air. HEENT:  Anicteric, EOMI, no nystagmus.  Pupils 1.5 mm, symmetric. Direct and consensual near reflex intact.  Mucous membranes moist. NECK:  No JVD, no bruit. HEART:  Regular rate and rhythm.  S1, S2. No murmurs, gallops or rubs. LUNGS:  Clear to auscultation bilaterally. ABDOMEN:  Soft, slightly  tender, no guarding, no rebound.  Negative Murphy sign. EXTREMITIES:  No cyanosis, clubbing or edema. SKIN:  No rashes. No Janeway, no Osler's, no splinter hemorrhages seen. LYMPH NODES:  No adenopathy. NEURO EXAM:  Nonfocal.  LABORATORY DATA:  WBC 9.0, hemoglobin 11.9, platelet count 297, BUN 39, creatinine 3.7.  Troponin-I less than 0.05.  Urinalysis negative.  AST 25, ALT 10, lipase 64 (elevated), CPK 194 (elevated), MB 1.3, relative index 0.7.  CT scan of the abdomen and pelvis shows no acute findings within the abdomen and pelvis, gallstones, calcified uterine fibroids, hiatal hernia.  Abdominal series shows unremarkable bowel-gas pattern, no free air.  One-view chest shows a normal heart and mediastinal shadows.  The lungs are clear.  ASSESSMENT AND PLAN: 1. Fever:  Uncertain source, this could be coming from  bronchitis     versus flu versus viral gastroenteritis that we would expect that     this will be resolving versus clostridium difficile.  We will     repeat a chest x-ray PA and lateral as the x-ray today was of poor     quality.  The patient will be empirically started on Avelox for     bronchitis.  The patient will also have stool studies for fecal     leukocytes, culture, C diff.  We will obtain a right upper quadrant     ultrasound to rule out any cholelithiasis/cholecystitis.  Blood     cultures x2 sets are pending. 2. Nausea, vomiting, diarrhea:  Zofran and Phenergan to treat     empirically the symptoms.  Protonix 40 mg IV b.i.d. 3. Acute renal failure:  Hold Avalide.  Check urine sodium, urine     creatinine, urine eosinophils.  Hydrate with normal saline IV.     Consider checking an SPEP and a UPEP. 4. Anemia:  Repeat a CBC in the a.m.  Consider checking iron studies,     B12, folic acid, SPEP, UPEP and Hemoccult stool if it worsens. 5. Deep vein thrombosis prophylaxis:  SCDs.     Massie Maroon, MD     JYK/MEDQ  D:  06/25/2010  T:  06/26/2010  Job:  [id number]  cc:   Renaye Rakers, M.D. Fax: [phone]  Electronically Signed by Pearson Grippe MD on 07/13/2010 01:28:44 PM

## 2010-07-14 LAB — GLUCOSE, CAPILLARY

## 2010-07-15 LAB — GLUCOSE, CAPILLARY: Glucose-Capillary: 110 mg/dL — ABNORMAL HIGH (ref 70–99)

## 2010-07-18 NOTE — H&P (Signed)
[name], [name]            ACCOUNT NO.:  [id number]  MEDICAL RECORD NO.:  [id number]           PATIENT TYPE:  E  LOCATION:  [facility]                         FACILITY:  [facility]  PHYSICIAN:  Eduard Clos, MDDATE OF BIRTH:  02/11/1921  DATE OF ADMISSION:  07/04/2010 DATE OF DISCHARGE:                             HISTORY & PHYSICAL   PRIMARY CARE PHYSICIAN:  Renaye Rakers, MD  CHIEF COMPLAINT:  Abdominal pain.  HISTORY OF PRESENT ILLNESS:  This is an 77 year old female with known history of diabetes mellitus type 2 and hypertension who was recently admitted to the hospital for nausea, vomiting, and diarrhea, had gone home a day ago and had come back because of persistent abdominal pain. The patient states she had been having persistent abdominal pain, mostly in the epigastric area along with recurrent nausea.  She said she did not throw up, did not have any diarrhea in the last 24 hours.  In the ER, the patient was found to be in addition febrile and mildly elevated LFTs and lipase.  Initially, surgical consult was called and Dr. Donell Beers has seen the patient and advised medical admission at this time and the patient could possibly have gallstone pancreatitis.  In addition, the patient also has been having some swelling of the lower extremities with erythema and pain, possibility of cellulitis also has been entertained.  The patient denies any shortness of breath.  Denies any chest pain, palpitation, dizziness, loss of consciousness, any focal headache, or visual symptoms.  PAST MEDICAL HISTORY:  Recent admission for nausea, vomiting, and diarrhea, diabetes mellitus type 2, hypertension, hyperlipidemia, and history of CA of breast.  PAST SURGICAL HISTORY:  Mastectomy, right total arthroplasty, and tubal pregnancy.  MEDICATIONS PRIOR TO ADMISSION:  The patient was just discharged on ciprofloxacin and Januvia.  SOCIAL HISTORY:  The patient does not smoke cigarette, drink  alcohol, or use illegal drugs.  Lives alone, but is frequented by many of her family members.  She is a full code.  FAMILY HISTORY:  Mother died of breast cancer and one sister died of throat cancer.  One of sisters has diabetes.  ALLERGIES:  No known drug allergies.  REVIEW OF SYSTEMS:  As per the history of present illness, nothing else significant.  PHYSICAL EXAMINATION:  GENERAL:  The patient is examined at bedside, not in acute distress. VITAL SIGNS:  Blood pressure is 118/40, pulse is 105 per minute, temperature 102.2, respirations 18 per minute, and O2 sat is 100%. HEENT: Anicteric.  No pallor.  No discharge from ears, eyes, nose, or mouth. CHEST:  Bilateral air entry present.  No rhonchi.  No crepitation.  S1 and S2 heard. ABDOMEN:  Soft.  There is tenderness in the epigastric area.  No guarding, no rigidity. CENTRAL NERVOUS SYSTEM:  The patient is alert, awake, and oriented to time, place, and person.  Moves upper and lower extremities. EXTREMITIES:  There is swelling in the right more than the left and there is erythema in the lower extremities, nontender.  No acute ischemic changes, clubbing, or cyanosis.  LABORATORY DATA:  Chest x-ray shows increased central venous congestion and mild interstitial edema.  CBC:  WBC is 5.6, hemoglobin is 10.2, hematocrit is 30.2, and platelets 425.  Complete metabolic panel: Sodium 132, potassium 4.3, chloride 98, carbon dioxide 21, glucose 129, BUN 25, creatinine 1.9.  Total bilirubin is 0.5, direct is 0.3, indirect is 0.2.  Alk phos is 125, AST 34, ALT 39, albumin 3, calcium 8.7, and lipase 65.  UA shows negative for nitrites and leukocytes, calcium oxylate crystals.  ASSESSMENT: 1. Abdominal pain, probably from gallstone pancreatitis. 2. Possible cellulitis of lower extremity. 3. Recent admission for nausea, vomiting, and diarrhea. 4. Fever, probably from gallstone pancreatitis and possible cellulitis     of the right lower  extremity. 5. History of diabetes mellitus, type 2. 6. History of hypertension. 7. Acute renal failure.  PLAN: 1. At this time, we will admit the patient to telemetry as there is a     possibility of the patient having fluid overload.  We will closely     monitored for this. 2. For her abdominal pain which could be from gallstone pancreatitis,     Surgery has already seen the patient.  I will discuss this with Dr.     Donell Beers.  At this time, we are going to keep the patient n.p.o., ice     chips fine, gently hydrate, cautious not to overhydrate the     patient, CBG checks, antibiotics.  At this time, the patient has     already started on Zosyn.  We will also add vancomycin  as the     patient has possibility of lower extremity cellulitis. 3. Lower extremity edema and possible cellulitis.  I am going to get a     Doppler of the lower extremity to rule out any DVT.  The patient     will be started on vancomycin  for this for possible cellulitis.     The patient is already on Zosyn. 4. Diabetes mellitus, type 1.  At this time, the patient will be     n.p.o.  CBG checks will be done every q.4.  Sensitive sliding scale if CBG is more than 200. 5. Hypertension.  P.r.n. antihypertensive if systolic more than 160. 6. We are going to gently hydrate the patient and stress diarrhea.  We     need to be cautious the patient does not get into fluid overload.     Eduard Clos, MD     ANK/MEDQ  D:  07/04/2010  T:  07/05/2010  Job:  [id number]  cc:   Renaye Rakers, M.D.  Electronically Signed by Midge Minium MD on 07/18/2010 05:02:07 PM

## 2010-07-20 ENCOUNTER — Observation Stay (HOSPITAL_COMMUNITY): Admission: AD | Admit: 2010-07-20 | Payer: Medicare Other | Source: Ambulatory Visit | Admitting: Internal Medicine

## 2010-07-20 ENCOUNTER — Observation Stay (HOSPITAL_COMMUNITY)
Admission: AD | Admit: 2010-07-20 | Discharge: 2010-07-21 | Disposition: A | Discharge status: Skilled Nursing Facility | Payer: PRIVATE HEALTH INSURANCE | Source: Ambulatory Visit | Attending: Internal Medicine | Admitting: Internal Medicine

## 2010-07-20 DIAGNOSIS — E785 Hyperlipidemia, unspecified: Secondary | ICD-10-CM | POA: Insufficient documentation

## 2010-07-20 DIAGNOSIS — D509 Iron deficiency anemia, unspecified: Principal | ICD-10-CM | POA: Insufficient documentation

## 2010-07-20 DIAGNOSIS — I4891 Unspecified atrial fibrillation: Secondary | ICD-10-CM | POA: Insufficient documentation

## 2010-07-20 DIAGNOSIS — E119 Type 2 diabetes mellitus without complications: Secondary | ICD-10-CM | POA: Insufficient documentation

## 2010-07-20 DIAGNOSIS — K219 Gastro-esophageal reflux disease without esophagitis: Secondary | ICD-10-CM | POA: Insufficient documentation

## 2010-07-20 LAB — CBC
Hemoglobin: 8.3 g/dL — ABNORMAL LOW (ref 12.0–15.0)
MCHC: 30.2 g/dL (ref 30.0–36.0)
RBC: 3.4 MIL/uL — ABNORMAL LOW (ref 3.87–5.11)

## 2010-07-20 LAB — MRSA PCR SCREENING: MRSA by PCR: NEGATIVE

## 2010-07-20 LAB — URINE CULTURE: Culture  Setup Time: 201202042025

## 2010-07-20 LAB — GLUCOSE, CAPILLARY: Glucose-Capillary: 95 mg/dL (ref 70–99)

## 2010-07-21 LAB — GLUCOSE, CAPILLARY
Glucose-Capillary: 76 mg/dL (ref 70–99)
Glucose-Capillary: 89 mg/dL (ref 70–99)

## 2010-07-21 LAB — IRON AND TIBC
Iron: 22 ug/dL — ABNORMAL LOW (ref 42–135)
TIBC: 208 ug/dL — ABNORMAL LOW (ref 250–470)
UIBC: 186 ug/dL

## 2010-07-21 LAB — ABO/RH: ABO/RH(D): A POS

## 2010-07-22 LAB — CROSSMATCH
ABO/RH(D): A POS
Antibody Screen: NEGATIVE
Unit division: 0

## 2010-07-28 NOTE — Discharge Summary (Signed)
[name]            ACCOUNT NO.:  [id number]  MEDICAL RECORD NO.:  [id number]           PATIENT TYPE:  I  LOCATION:  5006                         FACILITY:  [facility]  PHYSICIAN:  Baltazar Najjar, MD     DATE OF BIRTH:  02/11/1921  DATE OF ADMISSION:  07/04/2010 DATE OF DISCHARGE:  07/14/2010                              DISCHARGE SUMMARY   FINAL DISCHARGE DIAGNOSES: 1. Gallstone pancreatitis, status post laparoscopic cholecystectomy. 2. Hospital-acquired pneumonia. 3. Hypertension. 4. Type 2 diabetes mellitus. 5. Hyperlipidemia. 6. History of breast cancer. 7. Microcytic anemia, status post EGD that showed nonbleeding gastric     arteriovenous malformation.  CONSULTATIONS DURING THIS HOSPITALIZATION: 1. General Surgery.  The patient was seen by Almond Lint, MD for     gallstone pancreatitis. 2. GI.  The patient was seen by Jordan Hawks. Elnoria Howard, MD for anemia and     heme-positive stool.  PROCEDURES DURING THIS HOSPITALIZATION:  Laparoscopic cholecystectomy done on July 09, 2010.  RADIOLOGY/IMAGING STUDIES: 1. Chest x-ray on July 04, 2010 showed increased central venous     congestion, mild interstitial edema. 2. Chest x-ray on July 05, 2010 showed mild interstitial edema and     small bilateral effusion. 3. Chest x-ray done on July 10, 2010 showed basilar opacity, right     greater than left, cannot exclude pneumonia with a small effusion.     Also, questionable mild pulmonary vascular congestion.  BRIEF ADMITTING HISTORY:  Please refer to the dictated H and P.  In summary, [name] is an 77 year old [place] woman admitted to the hospital on July 04, 2010 after she presented with abdominal pain, nausea, and vomiting.  HOSPITAL COURSE: 1. The patient was found to have pancreatitis.  She had an abdominal     sonogram that showed evidence of gallstone back on June 26, 2010.  The patient was admitted with a diagnosis of  gallstone     pancreatitis.  Her lipase was elevated on presentation.  Surgical     Service was consulted.  The patient was seen by Dr. Donell Beers, and she     underwent laparoscopic cholecystectomy on July 10, 2010. 2. Pneumonia.  Hospital course complicated by pneumonia after the     patient had symptoms of increasing chest congestion and low-grade     fever.  Her chest x-ray showed findings suggestive of pneumonia.     The patient was treated with Avelox.  Noted that the patient was on     Zosyn and vancomycin on admission for suspicion of possible lower     extremity cellulitis. 3. Concern for lower extremity cellulitis.  On admission, the patient     was initially placed on Zosyn and vancomycin.  She had Doppler     sonogram for her lower extremities that showed no evidence of DVT.     The patient's lower extremity currently has no evidence of     cellulitis, which is probably treated already. 4. Anemia.  The patient had EGD done during this hospitalization that     showed nonbleeding gastric arteriovenous malformation.  Her  hemoglobin stayed stable between 7.5 to 8.  She does have positive     stool for occult blood, and she will need close monitoring by her     PCP as an outpatient. 5. Diabetes mellitus type 2.  The patient was on Januvia as an     outpatient that was discontinued during this hospitalization     secondary to her pancreatitis.  The patient was placed on insulin     sliding scale, and I will discharge her on insulin sliding scale as     well.  We will provide her with this hospitalization scale that we     are using.  She will need further adjustment of her insulin regimen     and close monitoring of fingerstick, and I will defer that to her     PCP. 6. Hypertension was uncontrolled, and I will resume her Benicar 40 mg     1 tablet p.o. daily on discharge, which was held this admission. 7. Physical deconditioning.  The patient was seen by PT/OT, who      recommended SNF placement. 8. The patient seen and examined by me today, and she is stable for     discharge.  DISCHARGE MEDICATIONS: 1. Insulin aspart 1-9 units subcutaneously q.4 h p.r.n. as per scale,     and we will provide hospital scale on discharge to be used in the     nursing home. 2. Avelox 400 mg p.o. daily for 3 days to complete 7 days of     antibiotics. 3. Protonix 40 mg p.o. daily. 4. Aspirin 81 mg p.o. daily. 5. Benicar 40 mg 1 tablet p.o. daily. 6. Hydrocodone/APAP 5/500 mg 1 tablet p.o. daily. 7. MiraLax 17 g p.o. daily as needed.  DISCHARGE INSTRUCTIONS: 1. The patient to continue above medications as prescribed. 2. The patient to follow with her PCP for further adjustment of her     insulin dose, monitoring of her blood pressure, and further     followup. 3. The patient was on hydroxyurea noted on her outpatient medication     that was stopped on discharge, and she was not continued on during     this hospitalization.  I am not sure what was the need for     hydroxyurea use for her, and I will defer reconsideration for her     outpatient medication to her PCP. 4. The patient to report any worsening of symptoms or any new symptoms     to PCP or come back to the ED. 5. Followup.  The patient to follow up with her PCP within 1 week of     discharge, and follow with Dr. Donell Beers in 2-3 weeks of discharge.  CONDITION ON DISCHARGE:  Stable.          ______________________________ Baltazar Najjar, MD     SA/MEDQ  D:  07/14/2010  T:  07/14/2010  Job:  [id number]  cc:   Renaye Rakers, M.D. Almond Lint, MD  Electronically Signed by Hannah Beat MD on 07/27/2010 09:26:08 PM

## 2010-07-28 NOTE — Op Note (Signed)
[name], [name]            ACCOUNT NO.:  [id number]  MEDICAL RECORD NO.:  [id number]           PATIENT TYPE:  LOCATION:                                 FACILITY:  PHYSICIAN:  Almond Lint, MD       DATE OF BIRTH:  02/11/1921  DATE OF PROCEDURE:  07/09/2010 DATE OF DISCHARGE:                              OPERATIVE REPORT   PREOPERATIVE DIAGNOSIS:  Gallstone pancreatitis.  POSTOPERATIVE DIAGNOSIS:  Gallstone pancreatitis.  PROCEDURE:  Laparoscopic cholecystectomy with intraoperative cholangiogram.  ASSISTANT:  Almond Lint, MD  ASSISTANT:  Adolph Pollack, MD  ANESTHESIA:  General and local.  OPERATIVE FINDINGS:  Dense adhesions in the abdomen.  Eventually, normal cholangiogram and with probable air bubble.  Right inguinal hernia.  SPECIMEN:  Gallbladder to pathology.  ESTIMATED BLOOD LOSS:  100 mL.  COMPLICATIONS:  None known.  PROCEDURE:  [name] was identified in the holding area and taken to the operating where she was placed supine on the operating room table. General anesthesia was induced.  Her abdomen was prepped and draped in sterile fashion.  Time-out was performed according to the surgical safety check list.  When all was correct, we continued.  The infraumbilical skin was then anesthetized with local anesthetic and a curvilinear transverse incision was made with a #11 blade.  The subcutaneous tissues were spread with a Kelly and the umbilical fascia was elevated with two Kocher clamps.  The fascia was incised in the midline.  A Kelly clamp was used to confirm entrance into the peritoneal cavity.  A pursestring suture was placed around the fascial incision with 0-Vicryl.  The Hasson was introduced into the abdomen and held in place of the abdominal wall with the tails of suture.  Pneumoperitoneum was achieved to a pressure of 15 mm Hg.  The patient was placed in a reverse Trendelenburg position and rotated to the left.  When the camera was  entered into the abdomen, it was apparent that we were underneath the omentum and transverse colon.  When I was able to see the right lower quadrant abdominal wall a 5-mm trocar was placed here after administration of local.  The adhesions in the right upper quadrant were taken down and a second 5-mm trocar was placed in the right upper quadrant.  The camera was moved over to one of these incisions and the additional upper abdominal incisions were taken down. Additionally, the adhesions were freed out near the umbilical trocar. An 11-mm port was then placed in the epigastrium.  At this point, the gallbladder was visualized and the fundus elevated toward the head.  The infundibulum was retracted laterally.  The peritoneum was opened up with the cautery over the cystic duct.  The cystic duct was skeletonized. The cystic artery was skeletonized.  The cystic duct was clipped high up on the gallbladder and the cystic artery was clipped twice on the patient's side and once on the specimen side.  The scissors were used to create a ductotomy on the cystic duct and used to transect the cystic artery.  A very small vessel on the side of the cystic duct was  bleeding and this was coagulated with the [place].  The cholangiogram catheter was advanced through the abdominal wall and placed into the cystic duct. This was clipped into place and flushed easily.  The patient was placed back supine and a cholangiogram was shot.  There appeared to be a filling defect in the common duct going toward the left hepatic duct and then the patient was placed into reverse Trendelenburg position.  After waiting and flushing several times, this filling defect disappeared and there was contrast entering into the duodenum with no evidence of meniscal defect any more.    The patient was placed back into the reverse Trendelenburg position and rotated to the left, and the cystic duct was triply clipped on the patient's  side.  This was then transected.  The hook cautery was used to take the gallbladder off of the gallbladder fossa.  At various points during the dissection, the gallbladder was entered was extremely thin walled.  The gallbladder ended up peeling off the fossa and this was then retrieved through the umbilical incision with the EndoCatch bag.  The abdomen was then irrigated and inspected for hemostasis.  There were few small clots scattered through the abdomen from the adhesiolysis and these were suctioned out as best as possible.  The left lower quadrant inspection demonstrated no evidence of blood pulling in the abdomen.  There was an incidental right inguinal hernia defect seen.  A piece of SNoW hemostatic agent was placed into the gallbladder fossa after it was inspected for hemostasis.  There was no gross evidence of bleeding, but there was a little bit of a raw surface on the liver.  Given the patient's anemia, this was left in prophylactically.  The camera was then placed in the epigastric port. The umbilicus was closed with a pursestring suture, this was airtight and there was no residual palpable fascial defect.  A two right upper quadrant ports were then removed.  The epigastric port was opened to allow all the gas to evacuate from it and then this was removed.  All of the incisions were then closed using 4-0 Monocryl in subcuticular fashion.  The wounds were then cleaned, dried and dressed with Dermabond.  The patient was awaken from anesthesia and taken to PACU in stable condition.     Almond Lint, MD     FB/MEDQ  D:  07/09/2010  T:  07/10/2010  Job:  [id number]  Electronically Signed by Almond Lint MD on 07/27/2010 12:47:33 PM

## 2010-08-10 NOTE — H&P (Signed)
[name], [name]            ACCOUNT NO.:  [id number]  MEDICAL RECORD NO.:  [id number]           PATIENT TYPE:  I  LOCATION:  1312                         FACILITY:  [facility]  PHYSICIAN:  Massie Maroon, MD        DATE OF BIRTH:  13-Mar-1921  DATE OF ADMISSION:  07/20/2010 DATE OF DISCHARGE:                             HISTORY & PHYSICAL   PRIMARY CARE PHYSICIANS: 1. Oswald Hillock, MD 2. Massie Maroon, MD  CHIEF COMPLAINT:  I am anemic.  HISTORY OF PRESENT ILLNESS:  77 year old female, resident of [facility], status post admission for gallstone pancreatitis, status post laparoscopic cholecystectomy and hospital-acquired pneumonia presents with anemia.  Hemoglobin was 7.4 at the nursing home.  Because of the patient's anemia, she was sent for transfusion.  The patient denies any chest pain, palpitations, nausea, vomiting, diarrhea, bright red blood per rectum, or black stool.  PAST MEDICAL HISTORY: 1. Gallstone pancreatitis, status post laparoscopic cholecystectomy. 2. Hospital-acquired pneumonia. 3. Hypertension. 4. Hyperlipidemia. 5. Type 2 diabetes mellitus. 6. Anemia. 7. Status post EGD that showed nonbleeding gastric AVM. 8. History of breast cancer.  PAST SURGICAL HISTORY: 1. Mastectomy. 2. Right total arthroplasty. 3. Tubal pregnancy. 4. Arthroscopic decompression, acromioplasty, synovectomy of the right     shoulder and minimal incision right rotator cuff repair and anchor     suture and FiberWire on August 31, 2006, by Dr. Montez Morita.  SOCIAL HISTORY:  The patient does not smoke or drink.  She is a former smoker who quit 60 years ago.  She lives by herself.  Previously, she was typically independent of her activities of daily living.  She lived in [place], [place] in [place].  FAMILY HISTORY:  Mother died at age 34 of breast cancer and was a smoker.  Sister died of throat cancer at the age of 24 and was a smoker. Father died of stroke at age 47.   The patient has 1 daughter with diabetes.  CODE STATUS:  Full Code.  ALLERGIES:  No known drug allergies.  MEDICATIONS: 1. Protonix 40 mg p.o. daily. 2. MiraLax 17 g p.o. daily. 3. Enteric-coated aspirin 81 mg p.o. daily. 4. Benicar 40 mg p.o. daily. 5. Vicodin 5/500 mg 1 p.o. q.6 h. p.r.n. pain. 6. __________ 30 cc p.o. b.i.d. for 30 days. 7. Milk of magnesia 30 mL p.o. q.4 h. p.r.n. constipation. 8. NovoLog sliding scale.  REVIEW OF SYSTEMS:  Negative for all 10 organ systems except for pertinent positives stated above.  PHYSICAL EXAMINATION:  VITAL SIGNS:  Temperature 99, pulse 90, blood pressure 167/89, pulse ox 96% on 2 L nasal cannula. HEENT:  Anicteric, pale conjunctiva. NECK:  No JVD.  No bruit. HEART:  Regular rate and rhythm, S1, S2.  No murmurs, gallops, or rubs. LUNGS:  Clear to auscultation bilaterally. ABDOMEN:  Soft, nontender, nondistended.  Positive bowel sounds. EXTREMITIES:  No cyanosis, clubbing, or edema. SKIN:  No rashes. LYMPH NODES:  No adenopathy. NEUROLOGIC:  Exam nonfocal.  LABORATORY DATA:  Hemoglobin 7.1.  ASSESSMENT AND PLAN: 1. Anemia:  The patient will be transfused 2 units of packed red blood  cells.  We will premedicate with Tylenol and Benadryl and transfuse     each unit over 3 hours and give Lasix 20 mg IV between units.     Check iron studies, B12, folic acid.  Discontinue aspirin. 2. Diabetes type 2:  Fingerstick blood sugars a.c. and h.s.  NovoLog     insulin sliding scale. 3. Hypertension:  Benicar 40 mg p.o. daily. 4. Deep venous thrombosis prophylaxis:  SCDs.     Massie Maroon, MD     JYK/MEDQ  D:  07/20/2010  T:  07/20/2010  Job:  [id number]  cc:   Oswald Hillock, MD  Massie Maroon, MD Fax: [phone]  Electronically Signed by Pearson Grippe MD on 08/10/2010 10:18:59 PM

## 2010-08-10 NOTE — Discharge Summary (Signed)
[name], [name]            ACCOUNT NO.:  [id number]  MEDICAL RECORD NO.:  [id number]           PATIENT TYPE:  I  LOCATION:  1312                         FACILITY:  [facility]  PHYSICIAN:  Massie Maroon, MD        DATE OF BIRTH:  14-Apr-1921  DATE OF ADMISSION:  07/20/2010 DATE OF DISCHARGE:                              DISCHARGE SUMMARY   DISCHARGE DIAGNOSES: 1. Anemia status post transfusion 2 units packed red blood cells. 2. Gallstone pancreatitis status post laparoscopic cholecystectomy.  SECONDARY DIAGNOSES: 1. Hospital-acquired pneumonia. 2. Hypertension. 3. Hyperlipidemia. 4. Type 2 diabetes. 5. Anemia.  EGD that showed nonbleeding gastric arteriovenous     malformation. 6. Mastectomy. 7. Right total arthroplasty. 8. Tubal pregnancy. 9. Arthroscopic decompression, acromioplasty, synovectomy of the right     shoulder, and minimal incision right rotator cuff repair and anchor     suture FiberWire on August 31, 2006, by Dr. Montez Morita.10.Atrial fibrillation with rapid ventricular response.  DISCHARGE MEDICATIONS: 1. Enteric-coated aspirin 81 mg p.o. daily for AFib. 2. Benicar 40 mg p.o. daily. 3. Vicodin 5/500 mg one p.o. q.6 h. p.r.n. pain. 4. Milk of magnesia 30 mL p.o. daily p.r.n. 5. Protonix 40 mg p.o daily. 6. MiraLax 17 g p.o. daily p.r.n. 7. Tylenol 650 mg p.o. q.4 h. p.r.n. pain. 8. Provide Gold Pro-Stat liquid supplement 30 mL p.o. b.i.d.  HOSPITAL COURSE:  An 77 year old female with a history of nonbleeding gastric AVM, recent gallstone pancreatitis status post laparoscopic cholecystectomy with chronic anemia, had a CBC checked at nursing home and hemoglobin was about 7.5.  The patient was sent for transfusion of 2 units packed red blood cells to increase her hemoglobin.  The patient was typed and crossed for 2 units packed red blood cells.  The patient was premedicated with Benadryl and Tylenol.  The patient was transfused each unit over 3 hours and given  Lasix 20 mg IV between units and then 40 mg IV after second unit.  The patient appears to be stable and will be discharged back to nursing home.  CODE STATUS:  Full code.  SOCIAL HISTORY/FAMILY HISTORY:  Please see dated H and P July 20, 2010.  PHYSICAL EXAMINATION:  VITAL SIGNS:  Temperature 97.5, pulse 95, blood pressure 153/83, pulse oxing 99% 2 L nasal cannula. HEENT:  Anicteric. NECK:  No JVD, no bruit. HEART:  Regular rate and rhythm, S1, S2. LUNGS:  Slight crackles at the bilateral bases, otherwise clear. ABDOMEN:  Soft, obese, nontender, nondistended.  Positive bowel sounds. EXTREMITIES:  No cyanosis, clubbing, or edema.  LABORATORY DATA:  WBC 3.6, hemoglobin 8.3, platelet count 488.  B12 of 421, folic acid 10.2.  Ferritin 201, iron saturation 11%, iron 22, TIBC 208.  ASSESSMENT AND PLAN: 1. Anemia:  Status post transfusion of 2 units of packed red blood     cells.  The patient is probably slightly iron deficient.  The blood     probably contain enough iron that she may not require iron     supplementation at this time.  I would recommend repeating a CBC     today and then repeating a  CBC in about 2 weeks. 2. Diabetes type 2:  Continue NovoLog sliding scale at the nursing     home. 3. Hypertension:  Continue Benicar. 4. Hyperlipidemia: Check lipid panel in 2 weeks. 5. Gastroesophageal reflux disease/history of gastrointestinal bleed:     Continue Protonix 40 mg p.o. daily. 6. Atrial fibrillation, history of:  Enteric-coated aspirin 81 mg p.o.     daily. 7. Dispo:  Back to nursing home today after social work case     management consult.     Massie Maroon, MD     JYK/MEDQ  D:  07/21/2010  T:  07/21/2010  Job:  [id number]  Electronically Signed by Pearson Grippe MD on 08/10/2010 10:18:35 PM

## 2010-08-23 NOTE — Consult Note (Signed)
NAMEVIVICA, [name]            ACCOUNT NO.:  [id number]  MEDICAL RECORD NO.:  [id number]           PATIENT TYPE:  I  LOCATION:  5006                         FACILITY:  [facility]  PHYSICIAN:  Jordan Hawks. Elnoria Howard, MD    DATE OF BIRTH:  02/11/1921  DATE OF CONSULTATION:  07/07/2010 DATE OF DISCHARGE:                                CONSULTATION   REASON FOR CONSULTATION:  Anemia and heme-positive stool.  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 The patient was recently admitted to the hospital on June 25, 2010, with complaints of abdominal pain, nausea, vomiting, and diarrhea. However, at this time, she reports having some issues with the abdominal pain which is located in the left upper quadrant epigastric region.  The patient started to have these acute type of symptoms.  No diarrhea was reported at this time.  However, with the persistence of symptoms, she presented to the emergency room for further evaluation and treatment. During the workup, she was identified to have some mild elevation in transaminases and also an ultrasound revealed that she had gallbladder sludge and cholelithiasis.  The lipase is minimally elevated at 65.  As a result of this findings, Surgical consultation was requested and they felt that there may be the possibility of a biliary pancreatitis.  Over the course of her hospitalization, her pain has resolved.  She has not had any further nausea and vomiting.  She is able to tolerate p.o. without any difficulties.  Her transaminases have also resolved. However, her hemoglobin did decline from the 10 range into the upper 7 range and subsequently GI consultation was requested.   Hemoccult was identified to be positive, but the patient denies having any melena and in fact she states not having any bowel movements at this time.  As a result of these findings, she underwent an EGD today, performed by Dr. Elnoria Howard, and she was identified to have multiple gastric AVMs.  The AVMs were not noted during the 2007 EGD and no AVMs in the capsule endoscopy. The AVMs were all treated at this time with APC and there was no evidence of any bleeding AVMs prior to the treatment of these lesions.  In October 2007, she did undergo workup for her iron deficiency anemia and CMV or colonoscopy did reveal that she had several large cecal AVMs which were cauterized.  A repeat colonoscopy was performed several days afterwards because the capsule endoscopy did reveal that there is some oozing at the site.  Seven hemoclips were deployed and she subsequently did not have any further issues with bleeding.  PAST MEDICAL HISTORY AND PAST SURGICAL HISTORY:  As stated above.  FAMILY HISTORY:  Noncontributory.  SOCIAL HISTORY:  Negative for alcohol, tobacco, or illicit drug use.  ALLERGIES:  No known drug allergies.  REVIEW OF SYSTEMS:  As stated above in history of present illness, otherwise negative.  ALLERGIES:  IODINE which causes wheezing and coughing.  MEDICATIONS: 1. Sliding scale insulin. 2. Protonix 40 mg IV daily. 3. MiraLax 17 g p.o. daily. 4. Tylenol 650 mg p.o. q.4 hours p.r.n. 5. Hydralazine 10 mg p.o. q.4 hours p.r.n. 6. Zofran 4 mg IV q.6 hours p.r.n.  PHYSICAL EXAMINATION:  VITAL SIGNS:  Blood pressure is 135/78, heart rate is 77, respirations 20, temperature is 97.7. GENERAL:  The patient is in no acute distress, alert and oriented. HEENT:  Normocephalic, atraumatic.  Extraocular muscles intact. NECK:  Supple.  No lymphadenopathy. LUNGS:  Clear to auscultation bilaterally. CARDIOVASCULAR:  Regular rate and rhythm. ABDOMEN:  Flat, soft, nontender,  nondistended. EXTREMITIES:  No clubbing, cyanosis, or edema.  LABORATORY VALUES:  White blood cell count 4.5, hemoglobin 8.4, MCV 76.8, platelet count 402.  Sodium 136, potassium 3.8, chloride 107, CO2 24, glucose 99, BUN 15, creatinine 1.5.  Total bilirubin 0.3, alk phos 111, AST 37, ALT 29.  Serum iron is less than 10, ferritin is 461.  IMPRESSION: 1. Nonbleeding gastric arteriovenous malformations status post APC     performed today during EGD. 2. Anemia. 3. Heme-positive stool. 4. Resolved epigastric left upper quadrant pain. 5. Cholelithiasis. 6. History of cecal arteriovenous malformations.  I had a discussion with Dr. Michaell Cowing in regards to the timing and urgency of the laparoscopic cholecystectomy.  From his standpoint, he is not completely convinced that her complaints are secondary to biliary issue. Her lipase was only minimally elevated and the transaminases were also mildly elevated but currently returned back to normal.  Overall, she is stable at this time.  The EGD was revealing for nonbleeding gastric AVMs and this could be the source of bleeding of the acute anemia.  Since cholecystectomy does not need to be performed on emergent basis, then I feel that there can be some time with consideration for repeat colonoscopy.  I am not completely convinced that she requires a repeat colonoscopy at this time.  She does have a chronic anemia which ranges in the 10-11 range.  This history of the cecal AVMs could be a source of the anemia, however, I feel that it would be prudent to follow her hemoglobin currently and to transfuse as necessary.  Certainly if she has recurrent anemia and requiring transfusions, then a colonoscopy can be pursued.  I will defer the decision for her laparoscopic cholecystectomy doctor, Dr. Michaell Cowing.  Plan is to follow hemoglobin and transfuse as necessary and further management issues will be made pending the clinical course.     Jordan Hawks Elnoria Howard,  MD     PDH/MEDQ  D:  07/07/2010  T:  07/08/2010  Job:  [id number]  cc:   Renaye Rakers, M.D.  Electronically Signed by Jeani Hawking MD on 08/23/2010 08:51:37 AM

## 2010-08-25 NOTE — Progress Notes (Signed)
  NAME:  [name], [name]            ACCOUNT NO.:  [id number]  MEDICAL RECORD NO.:  [id number]           PATIENT TYPE:  I  LOCATION:  5006                         FACILITY:  [facility]  PHYSICIAN:  [facility]      DATE OF BIRTH:  02/11/1921                                PROGRESS NOTE   PRIMARY CARE PHYSICIAN: Renaye Rakers, MD  CURRENT DIAGNOSES: 1. Gallstone pancreatitis, resolved, status post laparoscopic     cholecystectomy on July 10, 2010. 2. Pneumonia on current course of Avelox to be completed on July 18, 2010. 3. Hypertension. 4. Type 2 diabetes mellitus. 5. Hyperlipidemia. 6. History of breast cancer. 7. Microcytic anemia status post EGD this admission with findings of     nonbleeding gastric arteriovenous malformations.  HOSPITAL COURSE: Up-to-date.  [name] is a very pleasant 77 year old lady was initially admitted to our hospital system on July 04, 2010, with complaints of abdominal pain, nausea, and vomiting.  She was found to have pancreatitis and subsequently an ultrasound did show evidence for gallstones.  She was treated conservatively and has tolerated advancing diets without any issue.  Because of her gallstones, a Surgery was consulted and they did want to perform a cholecystectomy on this admission.  However, they were concerned about her microcytic anemia and warned that she need a GI workup prior.  They proceeded to consult GI, Dr. Elnoria Howard who did an EGD.  The EGD was on July 07, 2010, and there were findings of nonbleeding gastric AVMs, a small hiatal hernia, and a nonobstructive Schatzki ring.  Dr. Elnoria Howard did not believe that a colonoscopy with necessary given the findings of the EGD.  Surgery proceeded to perform the cholecystectomy on July 09, 2010.  The patient has done very well postoperatively.  However, she is somewhat weak and deconditioned and physical therapy as recommending short-term SNF.  This will need to be arranged  prior to her discharge.  Pneumonia.  The patient was noted to have increased congestion and a low- grade temperature.  Because of this, a chest x-ray was performed on July 10, 2010, that showed findings for basilar opacities, right greater than left, cannot exclude pneumonia with small effusions. Because of this, we decided to start her on Avelox after which she is doing much better.  We have also started her on Mucinex for some congestion.  She will need to continue Avelox until July 18, 2010.  DISPOSITION: The patient will be discharged to skilled nursing facility as soon as bed becomes available.     Peggye Pitt, M.D.   ______________________________ [facility]    EH/MEDQ  D:  07/12/2010  T:  07/12/2010  Job:  [id number]  cc:   Renaye Rakers, M.D. Fax: [phone]  Electronically Signed by Peggye Pitt M.D. on 08/25/2010 08:36:20 PM

## 2010-08-30 LAB — CBC
HCT: 25.6 % — ABNORMAL LOW (ref 36.0–46.0)
HCT: 26 % — ABNORMAL LOW (ref 36.0–46.0)
Hemoglobin: 10.5 g/dL — ABNORMAL LOW (ref 12.0–15.0)
MCHC: 33.2 g/dL (ref 30.0–36.0)
MCHC: 33.4 g/dL (ref 30.0–36.0)
MCHC: 33.8 g/dL (ref 30.0–36.0)
MCV: 79.7 fL (ref 78.0–100.0)
MCV: 80.1 fL (ref 78.0–100.0)
Platelets: 460 10*3/uL — ABNORMAL HIGH (ref 150–400)
Platelets: 468 10*3/uL — ABNORMAL HIGH (ref 150–400)
RBC: 3.94 MIL/uL (ref 3.87–5.11)
RDW: 16.9 % — ABNORMAL HIGH (ref 11.5–15.5)
WBC: 6.6 10*3/uL (ref 4.0–10.5)

## 2010-08-30 LAB — GLUCOSE, CAPILLARY
Glucose-Capillary: 110 mg/dL — ABNORMAL HIGH (ref 70–99)
Glucose-Capillary: 110 mg/dL — ABNORMAL HIGH (ref 70–99)
Glucose-Capillary: 111 mg/dL — ABNORMAL HIGH (ref 70–99)
Glucose-Capillary: 119 mg/dL — ABNORMAL HIGH (ref 70–99)
Glucose-Capillary: 126 mg/dL — ABNORMAL HIGH (ref 70–99)
Glucose-Capillary: 131 mg/dL — ABNORMAL HIGH (ref 70–99)
Glucose-Capillary: 133 mg/dL — ABNORMAL HIGH (ref 70–99)
Glucose-Capillary: 81 mg/dL (ref 70–99)
Glucose-Capillary: 97 mg/dL (ref 70–99)

## 2010-08-30 LAB — APTT: aPTT: 35 seconds (ref 24–37)

## 2010-08-30 LAB — TYPE AND SCREEN

## 2010-08-30 LAB — PROTIME-INR
INR: 1.1 (ref 0.00–1.49)
INR: 1.3 (ref 0.00–1.49)
INR: 1.4 (ref 0.00–1.49)
INR: 1.9 — ABNORMAL HIGH (ref 0.00–1.49)
Prothrombin Time: 14.8 seconds (ref 11.6–15.2)
Prothrombin Time: 16.6 seconds — ABNORMAL HIGH (ref 11.6–15.2)
Prothrombin Time: 17 seconds — ABNORMAL HIGH (ref 11.6–15.2)
Prothrombin Time: 17.9 seconds — ABNORMAL HIGH (ref 11.6–15.2)
Prothrombin Time: 23.3 seconds — ABNORMAL HIGH (ref 11.6–15.2)

## 2010-08-30 LAB — COMPREHENSIVE METABOLIC PANEL
CO2: 26 mEq/L (ref 19–32)
Calcium: 10.2 mg/dL (ref 8.4–10.5)
Creatinine, Ser: 1.52 mg/dL — ABNORMAL HIGH (ref 0.4–1.2)
GFR calc non Af Amer: 33 mL/min — ABNORMAL LOW (ref >60)
Glucose, Bld: 94 mg/dL (ref 70–99)

## 2010-08-30 LAB — URINALYSIS, ROUTINE W REFLEX MICROSCOPIC
Ketones, ur: NEGATIVE mg/dL
Nitrite: NEGATIVE
Protein, ur: NEGATIVE mg/dL

## 2010-08-30 LAB — HEMOGLOBIN AND HEMATOCRIT, BLOOD: HCT: 32.8 % — ABNORMAL LOW (ref 36.0–46.0)

## 2010-09-01 LAB — CULTURE, BLOOD (ROUTINE X 2)

## 2010-09-01 LAB — CBC
HCT: 25 % — ABNORMAL LOW (ref 36.0–46.0)
HCT: 29.7 % — ABNORMAL LOW (ref 36.0–46.0)
Hemoglobin: 10 g/dL — ABNORMAL LOW (ref 12.0–15.0)
Hemoglobin: 9.5 g/dL — ABNORMAL LOW (ref 12.0–15.0)
MCHC: 33.7 g/dL (ref 30.0–36.0)
MCHC: 33.7 g/dL (ref 30.0–36.0)
MCHC: 33.8 g/dL (ref 30.0–36.0)
MCV: 81.1 fL (ref 78.0–100.0)
MCV: 81.6 fL (ref 78.0–100.0)
RBC: 3.08 MIL/uL — ABNORMAL LOW (ref 3.87–5.11)
RBC: 3.46 MIL/uL — ABNORMAL LOW (ref 3.87–5.11)
RDW: 21 % — ABNORMAL HIGH (ref 11.5–15.5)
RDW: 21 % — ABNORMAL HIGH (ref 11.5–15.5)

## 2010-09-01 LAB — URINE CULTURE

## 2010-09-01 LAB — DIFFERENTIAL
Basophils Relative: 0 % (ref 0–1)
Monocytes Relative: 14 % — ABNORMAL HIGH (ref 3–12)
Neutro Abs: 2.9 10*3/uL (ref 1.7–7.7)
Neutrophils Relative %: 61 % (ref 43–77)

## 2010-09-01 LAB — COMPREHENSIVE METABOLIC PANEL
Alkaline Phosphatase: 43 U/L (ref 39–117)
BUN: 34 mg/dL — ABNORMAL HIGH (ref 6–23)
Calcium: 9.3 mg/dL (ref 8.4–10.5)
Creatinine, Ser: 1.53 mg/dL — ABNORMAL HIGH (ref 0.4–1.2)
Glucose, Bld: 127 mg/dL — ABNORMAL HIGH (ref 70–99)
Potassium: 4.1 mEq/L (ref 3.5–5.1)
Total Protein: 7 g/dL (ref 6.0–8.3)

## 2010-09-01 LAB — BASIC METABOLIC PANEL
CO2: 21 mEq/L (ref 19–32)
CO2: 21 mEq/L (ref 19–32)
Calcium: 8.3 mg/dL — ABNORMAL LOW (ref 8.4–10.5)
Calcium: 8.7 mg/dL (ref 8.4–10.5)
Chloride: 110 mEq/L (ref 96–112)
Creatinine, Ser: 1.26 mg/dL — ABNORMAL HIGH (ref 0.4–1.2)
GFR calc Af Amer: 49 mL/min — ABNORMAL LOW (ref >60)
GFR calc non Af Amer: 40 mL/min — ABNORMAL LOW (ref >60)
Glucose, Bld: 82 mg/dL (ref 70–99)
Glucose, Bld: 85 mg/dL (ref 70–99)
Sodium: 136 mEq/L (ref 135–145)
Sodium: 140 mEq/L (ref 135–145)

## 2010-09-01 LAB — LACTIC ACID, PLASMA: Lactic Acid, Venous: 1.7 mmol/L (ref 0.5–2.2)

## 2010-09-01 LAB — GLUCOSE, CAPILLARY
Glucose-Capillary: 118 mg/dL — ABNORMAL HIGH (ref 70–99)
Glucose-Capillary: 76 mg/dL (ref 70–99)
Glucose-Capillary: 82 mg/dL (ref 70–99)
Glucose-Capillary: 85 mg/dL (ref 70–99)
Glucose-Capillary: 89 mg/dL (ref 70–99)
Glucose-Capillary: 97 mg/dL (ref 70–99)
Glucose-Capillary: 99 mg/dL (ref 70–99)

## 2010-09-01 LAB — TYPE AND SCREEN: Antibody Screen: NEGATIVE

## 2010-09-01 LAB — RETICULOCYTES: Retic Count, Absolute: 49.7 10*3/uL (ref 19.0–186.0)

## 2010-09-01 LAB — URINE DRUGS OF ABUSE SCREEN W ALC, ROUTINE (REF LAB)
Barbiturate Quant, Ur: NEGATIVE
Cocaine Metabolites: NEGATIVE
Methadone: NEGATIVE
Phencyclidine (PCP): NEGATIVE
Propoxyphene: NEGATIVE

## 2010-09-01 LAB — CARDIAC PANEL(CRET KIN+CKTOT+MB+TROPI)
CK, MB: 1.6 ng/mL (ref 0.3–4.0)
CK, MB: 2.1 ng/mL (ref 0.3–4.0)
Total CK: 230 U/L — ABNORMAL HIGH (ref 7–177)

## 2010-09-01 LAB — HEMOGLOBIN AND HEMATOCRIT, BLOOD
HCT: 28.1 % — ABNORMAL LOW (ref 36.0–46.0)
HCT: 30.1 % — ABNORMAL LOW (ref 36.0–46.0)
Hemoglobin: 10.1 g/dL — ABNORMAL LOW (ref 12.0–15.0)
Hemoglobin: 9.4 g/dL — ABNORMAL LOW (ref 12.0–15.0)

## 2010-09-01 LAB — IRON AND TIBC
Iron: 32 ug/dL — ABNORMAL LOW (ref 42–135)
Saturation Ratios: 9 % — ABNORMAL LOW (ref 20–55)
TIBC: 342 ug/dL (ref 250–470)

## 2010-09-01 LAB — PROTIME-INR
INR: 1.1 (ref 0.00–1.49)
Prothrombin Time: 14.5 seconds (ref 11.6–15.2)

## 2010-09-01 LAB — URINALYSIS, ROUTINE W REFLEX MICROSCOPIC
Nitrite: NEGATIVE
Protein, ur: NEGATIVE mg/dL
Specific Gravity, Urine: 1.011 (ref 1.005–1.030)
Urobilinogen, UA: 0.2 mg/dL (ref 0.0–1.0)

## 2010-09-01 LAB — POCT CARDIAC MARKERS

## 2010-09-01 LAB — LIPID PANEL
Cholesterol: 140 mg/dL (ref 0–200)
HDL: 67 mg/dL (ref >39)
Triglycerides: 68 mg/dL (ref <150)

## 2010-09-01 LAB — CK TOTAL AND CKMB (NOT AT ARMC): Relative Index: 0.8 (ref 0.0–2.5)

## 2010-09-01 LAB — HEMOGLOBIN A1C
Hgb A1c MFr Bld: 5.7 % (ref 4.6–6.1)
Mean Plasma Glucose: 117 mg/dL

## 2010-09-01 LAB — URINE MICROSCOPIC-ADD ON

## 2010-10-04 NOTE — Op Note (Signed)
[name], [name] NO.:  [id number]   MEDICAL RECORD NO.:  [id number]          PATIENT TYPE:  INP   LOCATION:  5524                         FACILITY:  [facility]   PHYSICIAN:  Myrtie Neither, MD      DATE OF BIRTH:  Aug 25, 1920   DATE OF PROCEDURE:  10/08/2008  DATE OF DISCHARGE:  08/09/2008                               OPERATIVE REPORT   PREOPERATIVE DIAGNOSIS:  Degenerative joint disease, right knee.   POSTOPERATIVE DIAGNOSIS:  Degenerative joint disease, right knee.   ANESTHESIA:  General.   PROCEDURE:  Right total knee arthroplasty, Biomet implant.   DESCRIPTION OF PROCEDURE:  The patient was taken to the operating room  after given adequate preop medications, given general anesthesia and  intubated.  Right lower extremity was prepped with DuraPrep and draped  in a sterile manner.  Tourniquet and Bovie were used for hemostasis.  Anterior midline incision was made over the right knee going from the  quadriceps down to the tibial tuberosity.  Sharp and blunt dissection  was made down to the capsule and paramedian capsule incision was made  from the quadriceps down to the tibial tuberosity.  Patella was  reflected laterally.  Knee was taken up into a flexed positions.  Soft  tissue resection was then done followed by tibial plateau surface  resection at 10-mm.  Next, reaming down the femoral canal was done.  Distal femoral jig was put in place.  Distal femoral cut was made at +3.  Sizing was then done which found to be 65-mm.  Cutting jig with the same  was then put in place.  Anterior, posterior cuts, and chamfer cuts were  done.  Placement of trial component was found to be very snug.  Attention was then turned to the tibia which was sized at 65 mm.  Appropriate cutting jig was put in place and appropriate cuts were made.  With the femoral and tibial trial components in place, range of motion,  full extension, full flexion, good medial and lateral stability,  particularly with the 12-mm.  Attention was turned to the patella which  was sized to medium, 34-mm.  Appropriate cutting jig was put in place  and the tibial surface resected.  With all three trial components put in  place, range of motion, full flexion, full extension, good medial and  lateral stability with 12-poly, no subluxation of the patella.  Trial  components were then removed.  Irrigation was then done with the  irrigator.  Methyl methacrylate was mixed.  The tibia and patella  components were cemented.  Femoral component was press-fitted.  Excess  methyl methacrylate was removed.  Trial 12-mm poly was then tried again  after the cement had set, and again full extension, full flexion, good  medial and lateral stability, no subluxation of the patella.  Trial 12-  mm poly was then locked in place.  Tourniquet was let down.  Hemostasis  obtained.  Wound closure was then done with 0 Vicryl for the fascia, 2-0  for the subcutaneous, and skin staples for the skin.  Compressive  dressing was then applied.  Knee immobilizer applied.  The patient  tolerated the procedure quite well and returned to the recovery room in  stable and satisfactory condition.      Myrtie Neither, MD  Electronically Signed     AC/MEDQ  D:  10/08/2008  T:  10/08/2008  Job:  [id number]

## 2010-10-04 NOTE — Discharge Summary (Signed)
NAMECICI, RODRIGES NO.:  [id number]   MEDICAL RECORD NO.:  [id number]          PATIENT TYPE:  INP   LOCATION:  5004                         FACILITY:  [facility]   PHYSICIAN:  Myrtie Neither, MD      DATE OF BIRTH:  02-10-1921   DATE OF ADMISSION:  10/08/2008  DATE OF DISCHARGE:  10/12/2008                               DISCHARGE SUMMARY   ADMITTING DIAGNOSIS:  Degenerative joint disease right knee.   DISCHARGE DIAGNOSIS:  Degenerative joint disease right knee.   COMPLICATIONS:  None.   INFECTIONS:  None.   OPERATION:  Right total hip arthroplasty done on Oct 08, 2008.   PERTINENT HISTORY:  This is an 77 year old followed in the office with  degenerative joint disease involving the right knee.  The patient has  been treated with anti-inflammatories and use of cane and therapeutic  injections.  The patient's condition progressively worsened with the  increased pain, loss of function and catching of the right knee.   PERTINENT PHYSICAL EXAM:  The right knee +2 effusion, crepitus in the  medial and lateral compartments.  Range of motion good but limited.   X-RAYS:  Reveal sclerosis, osteophyte, loss of joint space both medal  and lateral compartment of the right knee.   HOSPITAL COURSE:  The patient underwent preop laboratory, a CBC,  electrocardiogram, chest x-ray, PT/PTT, platelet count, UA, CMET.  The  patient's labs were stable enough to undergo surgery.  The patient  underwent right total knee arthroplasty, tolerated the procedure quite  well.  Postop course was fairly benign.  The patient did receive 2 units  of packed cells due to acute blood loss anemia.  Presently, pain is  under control with the use of Percocet, tolerating CPM well and  weightbearing as tolerated with use of walker.  Right knee wound is  healing quite well.   MEDICATIONS:  The patient is on:  1. Sliding scale insulin.  2. Avandia 8 mg daily w.c.  3. Protonix 80 mg q.12.  4.  Hydrochlorothiazide 25 mg daily.  5. Benicar 40 mg daily.  6. Zocor 40 mg every 1800 hours.  7. Colace 100 mg b.i.d.  8. Ferrous sulfate 325 mg t.i.d.  9. Lorcet 10 one q.6 p.r.n. for pain.  10.Tylenol 1-2 q.4 p.r.n.  11.Robaxin 500 mg b.i.d.  12.The patient is also on Coumadin therapy 5 mg daily.   Weekly INRs.  Daily dressing changes to the right knee.  Physical  therapy, weightbearing as tolerated to the right knee with use of  walker.  The patient will be seen back in the office in 1 week.  The  patient is being discharged in stable and satisfactory condition.      Myrtie Neither, MD  Electronically Signed     AC/MEDQ  D:  10/12/2008  T:  10/12/2008  Job:  [id number]

## 2010-10-04 NOTE — Discharge Summary (Signed)
NAMECHEZNEY, [name]            ACCOUNT NO.:  [id number]   MEDICAL RECORD NO.:  [id number]          PATIENT TYPE:  INP   LOCATION:  5524                         FACILITY:  [facility]   PHYSICIAN:  Peggye Pitt, M.D. DATE OF BIRTH:  02/16/1921   DATE OF ADMISSION:  08/06/2008  DATE OF DISCHARGE:  08/09/2008                               DISCHARGE SUMMARY   PRIMARY CARE PHYSICIAN:  Renaye Rakers, MD   DISCHARGE DIAGNOSES:  1. Lower gastrointestinal bleed, resolved.  2. Acute blood loss anemia.  3. Hypertension.  4. Type 2 diabetes mellitus.  5. Hypertension.  6. History of breast cancer.   DISCHARGE MEDICATIONS:  None.   CONSULTATION THIS HOSPITALIZATION:  None.   IMAGES AND PROCEDURES PERFORMED DURING THIS HOSPITALIZATION:  1. Chest x-ray on August 07, 2008 that showed mild vascular congestion,      but no pulmonary edema with chronic lung changes.  2. CT scan of the abdomen and pelvis on August 07, 2008 that showed      cholelithiasis.  Moderate stool throughout the colon.  A small      hiatal hernia.  Calcified uterine fibroids with normal appearing      ovaries.   DISPOSITION AND FOLLOWUP:  [name] is discharged home in stable  condition.  Her hemoglobin has remained around 9.5.  She already has an  appointment scheduled with her primary care physician, Dr. Renaye Rakers  sometime next week.  At that time, I would recommend rechecking a CBC to  make sure she has stable blood counts.   HISTORY AND PHYSICAL EXAMINATION:  For full details, please refer to  dictation by Dr. Cherylin Mylar on August 07, 2008.  In brief, [name] is  a delightful 77 year old African American woman who has a past medical  history significant for diverticulosis and history of AVMs diagnosed via  colonoscopy 1-2 years ago performed by Dr. Elnoria Howard.  She came in with one  episode of bright red blood per rectum day prior to admission.  She  states that she had some left lower quadrant abdominal pain at  this time  about 3-4/10 in intensity.  She says that it was enough blood that it  feel the toilet and a gushed out.  There was only the one episode and  she did not have any more recurrence.  She was very concerned because  she has had this presentation in the past, which she had a colonoscopy  and hence she came into the emergency department where the hospitalist  service was called to admit her for further evaluation and management.   HOSPITAL COURSE BY ACTIVE PROBLEM:  1. Primary blood per rectum, was self resolved.  She had no episodes      while in the hospital.  This was likely secondary to either a      diverticular bleed or from her arteriovenous malformations that      have been diagnosed by prior colonoscopy.  Given self-resolving      episodes and no acute drop in hemoglobin, I decided not to consult      GI and not to perform  any further colonoscopies this      hospitalization.  2. Acute blood loss anemia, hemoglobin has remained stable at around      9.5.  Her hemoglobin was 10.0 upon admission.  When she follows up      with Dr. Parke Simmers, I would recommend redoing a CBC to monitor her      blood counts.  3. For her hypertension and does not appear that she is on any blood      pressure medications outside the hospital.  Her BP was slightly      elevated in the 140s systolic.  I have not started her on any      treatment.  This should be further monitored in the outpatient      setting.  4. For her type 2 diabetes mellitus, she says at home she takes      Avalide and Avandia; however, those were discontinued while in the      hospital and she has maintained excellent CBG control in the 70-90      range.  I have not discharged her on any oral hypoglycemic agents.   VITAL SIGNS ON DAY OF DISCHARGE:  Blood pressure 144/75, heart rate 66,  respirations 20, O2 sats 100% on room air with a temperature of 98.3.   LABS ON DAY OF DISCHARGE:  Sodium 140, potassium 4.5, chloride  111,  bicarb 21, BUN 15, creatinine 1.26 with a glucose of 85.  WBCs 4.2,  hemoglobin 9.5, and a platelet count of 511.   Labs that are pending at time of this dictation include an anemia panel  that should be followed up by primary care physician upon hospital  followup appointment.      Peggye Pitt, M.D.  Electronically Signed     EH/MEDQ  D:  08/09/2008  T:  08/09/2008  Job:  [id number]   cc:   Renaye Rakers, M.D.

## 2010-10-04 NOTE — H&P (Signed)
NAMEBARNEY, [name]            ACCOUNT NO.:  [id number]   MEDICAL RECORD NO.:  [id number]          PATIENT TYPE:  EMS   LOCATION:  MAJO                         FACILITY:  [facility]   PHYSICIAN:  Carlena Hurl, MDDATE OF BIRTH:  Apr 09, 1921   DATE OF ADMISSION:  08/06/2008  DATE OF DISCHARGE:                              HISTORY & PHYSICAL   CHIEF COMPLAINT:  One episode of bright red blood per rectum.   HISTORY OF PRESENT ILLNESS:  This is an 77 year old very pleasant  African American lady who has a past medical history significant for  diverticulosis diagnosed via colonoscopy in the past and history of AV  malformation diagnosed via colonoscopy in the past and coming in with 1  episode of bright red blood per rectum.  The history was given by the  patient.  According to her, when she went to the rest room yesterday  afternoon, she had 1 gush of bright red coming from the rectum.  She was  also having some left upper and left lower quadrant pain about 3-4/10.  There was only 1 episode of bleeding per rectum and after that she did  not have any more episodes.  She was very concerned.  She came to the ER  for further evaluation.  She denies having any nausea, vomiting,  diarrhea, or constipation.  She denies having any chest pain at that  time.  She had a colonoscopy done 1 year ago, which showed  diverticulosis.   PAST MEDICAL HISTORY:  Significant for history of diverticulosis,  history of AV malformation, history of chronic anemia with a baseline  hemoglobin of 9-10.6, history of diabetes mellitus type 2, history of  hypertension, and history of breast cancer with a benign mammogram in  February 2004, which was initially diagnosed in 1998.   PAST SURGICAL HISTORY:  She had a left-sided mastectomy with radiation  therapy in the past and history of right shoulder surgery.   MEDICATIONS:  The patient is on Avalide and Avandia, doses unknown at  this time.   ALLERGIES:   No known drug allergies except for she is allergic to  [facility].   FAMILY HISTORY:  Nothing significant.   SOCIAL HISTORY:  The patient lives in [place].  She lives in a senior  care apartments and lives by herself, gets help from a maid as needed  and pretty much takes care of herself.  She denies smoking, alcohol, or  IV drug abuse.   REVIEW OF SYSTEMS:  Pretty much the same as in history of present  illness.   PHYSICAL EXAMINATION:  GENERAL:  This is an 77 year old very pleasant  African American lady who is lying comfortably on the bed without any  significant chest pain or shortness of breath.  VITALS:  When she came into the ER, blood pressure 150/71, which later  dropped to 131/70, pulse rate of 94, respirations 18, temperature 96.9,  and saturating 96% on room air.  HEENT:  Head is atraumatic, normocephalic.  Pupils, PERRLA.  Tympanic  membrane intact.  No discharge from the eyes or ears.  LUNGS:  Clear to auscultation bilaterally.  CVS:  S1, S2 heard with regular rate and rhythm.  ABDOMEN:  Soft.  Bowel sounds present.  There is diffuse mild tenderness  present, little more on the left upper quadrant and left lower quadrant,  but no guarding, and no rigidity appreciated.  EXTREMITIES:  No pedal edema noted.  Pulses are palpable bilaterally.  CNS:  Awake, alert, and oriented x3.  No focal neurological deficits  noted.   LABORATORY DATA:  WBC of 4.8, hemoglobin of 10.0, which was 9.1 in April  of 2008 and platelets of 210.  INR of 1.1 and PTT of 34.  Sodium 136,  potassium 4.1, chloride 103, bicarb 25, glucose 127, BUN 34, creatinine  1.53, which was 1.69 in 2008 and AST, ALT, alk phos, albumin within  normal limits and lactic acid venous is 1.7.  First set of troponin I  less than 0.05 and first set of CK-MB 1.6.  The patient just had a CT of  abdomen and pelvis, which showed cholelithiasis, calcified gallstones,  moderate stool throughout the colon, small hiatal hernia,  no acute  pelvic findings, but calcified uterine fibroids.   ASSESSMENT AND PLAN:  This is an 77 year old gentleman who has a past  medical history significant for chronic anemia, iron-deficiency anemia,  history of diverticulosis, AV malformations coming in with 1 episode of  dark bright red blood per rectum.  1. Lower gastrointestinal bleed/rectal bleeding.  At this time, it is      most likely very secondary to diverticulosis versus AV      malformation.  The patient had a recent colonoscopy according to      her 1 year ago, which showed diverticulosis without any significant      bleeding.  This patient denies having any similar episodes in the      past.  She says her stools have always been black, but never had      this much amount of blood in the past.  The patient's baseline      hemoglobin is around 9-10.  Today, her hemoglobin is 10 and      clinically the patient is stable.  She will continue to monitor H      and H and if needed we will get a GI consult for the repeat of      colonoscopy to look for AV malformations or worsening      diverticulosis.  2. History of iron-deficiency anemia, which is secondary to      diverticulosis and the patient has a baseline hemoglobin of 9-10.  3. History of diabetes mellitus.  The patient's blood glucose have      been stable here.  The patient takes Avandia, but she is little      doubtful about the dose, so we are going to hold Avandia at this      time and continue on this patient on sliding scale insulin and once      we have the dose of Avandia, we will continue that.  4. History of hypertension.  The patient's blood pressure have been      stable here in the ER about 130/80 and she takes Avalide with dose      unknown, so once we get the dose, we will continue that.  5. For deep vein thrombosis prophylaxis, the patient will be given      SCDs.  6. For gastrointestinal prophylaxis, the patient will be started on      Protonix 40  mg p.o. 1 time a day.      Carlena Hurl, MD  Electronically Signed     JD/MEDQ  D:  08/07/2008  T:  08/07/2008  Job:  [id number]

## 2010-10-07 NOTE — Consult Note (Signed)
[name], [name]            ACCOUNT NO.:  [id number]   MEDICAL RECORD NO.:  [id number]          PATIENT TYPE:  INP   LOCATION:  6715                         FACILITY:  [facility]   PHYSICIAN:  Jordan Hawks. Elnoria Howard, MD    DATE OF BIRTH:  02/11/1921   DATE OF CONSULTATION:  03/10/2006  DATE OF DISCHARGE:                                   CONSULTATION   REASON FOR CONSULTATION:  Iron deficiency anemia.   PRIMARY CARE Jamese Trauger:  Renaye Rakers, M.D.   HISTORY OF PRESENT ILLNESS:  This is a 77 year old female with a past  medical history of breast cancer, pneumonias, diabetes, hypertension and  chronic anemia and reported history of diverticulosis, who is admitted to  the hospital with findings of a hemoglobin of 6.5.  The patient was being  evaluated by Dr. Parke Simmers as an outpatient for complaints of progressive  weakness, and the hemoglobin was noted to be markedly decreased because of  this finding and her symptoms.  She was subsequently admitted to the  hospital for further evaluation and treatment.  The patient did state that  she had a palpitation approximately 1 week ago and was feeling weak at that  time.  Additionally, she also reports having dyspnea on exertion.  She  apparently had a colonoscopy by Dr. Tad Moore in the past 2 years, and by her  recollection.  She was only noted to have diverticula.  Aside from this  time, the patient has been relatively stable, and her hemoglobin was low was  mildly decreased.  However, it is marked and there is a have significant  change at this time.  The patient denies any NSAID use.   PAST MEDICAL AND SURGICAL HISTORY:  Is as stated above.   FAMILY HISTORY:  Is noncontributory.   SOCIAL HISTORY:  The patient lives alone.  No alcohol, tobacco or illicit  drug use.   MEDICATIONS AT HOME:  1. Avalide 300/25 mg q.d.  2. Avandia 25 mg q.d.   REVIEW OF SYSTEMS:  Significant for shortness of breath, chest pain,  weakness, dyspnea on exertion.  No  diarrhea or constipation.  Positive for  of black stools.  No skin rashes, no arthritis, arthralgias.  No dysphagia,  dysarthria or no urinary symptoms.   PHYSICAL EXAMINATION:  VITAL SIGNS:  Blood pressure is 133/71, heart rate is  73, temperature is 97.6, respirations 20.  GENERAL:  The patient is in no acute distress, alert and oriented.  HEENT:  Normocephalic, atraumatic.  Extraocular muscles intact.  Pupils  equal and round, reactive to light.  NECK:  Supple.  No lymphadenopathy.  LUNGS:  Clear to auscultation bilaterally.  CARDIOVASCULAR:  Regular rate and rhythm.  ABDOMEN:  Is obese, soft, nontender, nondistended.  No hepatosplenomegaly.  EXTREMITIES:  No clubbing, cyanosis or edema.  RECTAL:  Positive for black stool is heme-positive.  There are no palpable  masses.   LABORATORY VALUES:  White blood cell count is 5.0, hemoglobin 6.4, MCV 69.1,  platelets at 564.  PT is 15.1, INR 1.2.  Sodium is 140, potassium 4.0,  chloride 108, CO2 25, glucose 78,  BUN 28, creatinine 1.5, total bili 0.5,  alk phos 36.  AST is 21, ALT 9, albumin is 3.2.  The patient has been ruled  out for myocardial infarction by cardiac enzymes.   IMPRESSION:  1. Iron deficiency anemia.  2. Symptomatic anemia with weakness and shortness of breath.  3. Heme-positive stool.   After evaluation of the patient, it is clear that she does require further  evaluation with an EGD and colonoscopy.  I am unable to identify the source  of bleeding at this time, but the possibilities may stem from upper GI  source in regards with her melena or a proximal colon lesion.   PLAN:  Is to perform EGD and colonoscopy tomorrow and to follow her  hemoglobin __________  with transfusion.      Jordan Hawks Elnoria Howard, MD  Electronically Signed     PDH/MEDQ  D:  03/10/2006  T:  03/11/2006  Job:  [id number]   cc:   Renaye Rakers, M.D.

## 2010-11-29 ENCOUNTER — Emergency Department (HOSPITAL_COMMUNITY)
Admission: EM | Admit: 2010-11-29 | Discharge: 2010-11-29 | Disposition: A | Discharge status: Home / Self Care | Payer: PRIVATE HEALTH INSURANCE | Attending: Emergency Medicine | Admitting: Emergency Medicine

## 2010-11-29 ENCOUNTER — Emergency Department (HOSPITAL_COMMUNITY): Payer: PRIVATE HEALTH INSURANCE

## 2010-11-29 DIAGNOSIS — S9030XA Contusion of unspecified foot, initial encounter: Secondary | ICD-10-CM | POA: Insufficient documentation

## 2010-11-29 DIAGNOSIS — Y92009 Unspecified place in unspecified non-institutional (private) residence as the place of occurrence of the external cause: Secondary | ICD-10-CM | POA: Insufficient documentation

## 2010-11-29 DIAGNOSIS — M79609 Pain in unspecified limb: Secondary | ICD-10-CM | POA: Insufficient documentation

## 2010-11-29 DIAGNOSIS — M19079 Primary osteoarthritis, unspecified ankle and foot: Secondary | ICD-10-CM | POA: Insufficient documentation

## 2010-11-29 DIAGNOSIS — E119 Type 2 diabetes mellitus without complications: Secondary | ICD-10-CM | POA: Insufficient documentation

## 2010-11-29 DIAGNOSIS — M949 Disorder of cartilage, unspecified: Secondary | ICD-10-CM | POA: Insufficient documentation

## 2010-11-29 DIAGNOSIS — I10 Essential (primary) hypertension: Secondary | ICD-10-CM | POA: Insufficient documentation

## 2010-11-29 DIAGNOSIS — M899 Disorder of bone, unspecified: Secondary | ICD-10-CM | POA: Insufficient documentation

## 2010-11-29 DIAGNOSIS — W208XXA Other cause of strike by thrown, projected or falling object, initial encounter: Secondary | ICD-10-CM | POA: Insufficient documentation

## 2010-11-29 DIAGNOSIS — Z853 Personal history of malignant neoplasm of breast: Secondary | ICD-10-CM | POA: Insufficient documentation

## 2011-03-08 LAB — CROSSMATCH
ABO/RH(D): A POS
Antibody Screen: NEGATIVE

## 2012-03-29 ENCOUNTER — Emergency Department (HOSPITAL_COMMUNITY): Payer: PRIVATE HEALTH INSURANCE

## 2012-03-29 ENCOUNTER — Encounter (HOSPITAL_COMMUNITY): Payer: Self-pay | Admitting: Emergency Medicine

## 2012-03-29 ENCOUNTER — Emergency Department (HOSPITAL_COMMUNITY)
Admission: EM | Admit: 2012-03-29 | Discharge: 2012-03-29 | Disposition: A | Discharge status: Home / Self Care | Payer: PRIVATE HEALTH INSURANCE | Attending: Emergency Medicine | Admitting: Emergency Medicine

## 2012-03-29 DIAGNOSIS — Z79899 Other long term (current) drug therapy: Secondary | ICD-10-CM | POA: Insufficient documentation

## 2012-03-29 DIAGNOSIS — E119 Type 2 diabetes mellitus without complications: Secondary | ICD-10-CM | POA: Insufficient documentation

## 2012-03-29 DIAGNOSIS — R109 Unspecified abdominal pain: Secondary | ICD-10-CM | POA: Insufficient documentation

## 2012-03-29 DIAGNOSIS — S8000XA Contusion of unspecified knee, initial encounter: Secondary | ICD-10-CM | POA: Insufficient documentation

## 2012-03-29 DIAGNOSIS — R071 Chest pain on breathing: Secondary | ICD-10-CM | POA: Insufficient documentation

## 2012-03-29 DIAGNOSIS — R0789 Other chest pain: Secondary | ICD-10-CM

## 2012-03-29 DIAGNOSIS — I10 Essential (primary) hypertension: Secondary | ICD-10-CM | POA: Insufficient documentation

## 2012-03-29 DIAGNOSIS — Y9301 Activity, walking, marching and hiking: Secondary | ICD-10-CM | POA: Insufficient documentation

## 2012-03-29 DIAGNOSIS — W1789XA Other fall from one level to another, initial encounter: Secondary | ICD-10-CM | POA: Insufficient documentation

## 2012-03-29 DIAGNOSIS — Y929 Unspecified place or not applicable: Secondary | ICD-10-CM | POA: Insufficient documentation

## 2012-03-29 HISTORY — DX: Essential (primary) hypertension: I10

## 2012-03-29 HISTORY — DX: Type 2 diabetes mellitus without complications: E11.9

## 2012-03-29 MED ORDER — ACETAMINOPHEN 325 MG PO TABS
650.0000 mg | ORAL_TABLET | Freq: Once | ORAL | Status: AC
  Administered 2012-03-29: 650 mg via ORAL
  Filled 2012-03-29: qty 2

## 2012-03-29 NOTE — ED Notes (Signed)
Pt sts tripped and fell c/o left knee pain and right rib pain after fall; swelling noted to knee; pt denies hitting head or LOC

## 2012-03-29 NOTE — ED Provider Notes (Signed)
History   This chart was scribed for Hilario Quarry, MD, by Marcina Millard scribe. The patient was seen in room TR06C/TR06C and the patient's care was started at 1700.    CSN: [id number]  Arrival date & time 03/29/12  1424   First MD Initiated Contact with Patient 03/29/12 1700      Chief Complaint  Patient presents with  . Fall  . Knee Pain    (Consider location/radiation/quality/duration/timing/severity/associated sxs/prior treatment) HPI Comments: [name] is a 78 y.o. female who presents to the Emergency Department complaining of constant, moderate left knee pain that radiates to the ankles and associated abdominal pain after falling while walking. She denies any LOC or hitting her head. She reports that she is normally ambulatory. She denies trying any treatments prior to arrival.   PCP is Dr. Parke Simmers. Dr. Montez Morita performed her knee surgery.   Patient is a 78 y.o. female presenting with fall and knee pain. The history is provided by the patient.  Fall The accident occurred 1 to 2 hours ago. The fall occurred while walking. She fell from a height of 3 to 5 ft. She landed on concrete. There was no blood loss. The point of impact was the left knee. The pain is present in the left knee (right abdomen). The pain is moderate. Associated symptoms include abdominal pain.  Knee Pain Associated symptoms include abdominal pain.    Past Medical History  Diagnosis Date  . Hypertension   . Diabetes mellitus without complication     History reviewed. No pertinent past surgical history.  History reviewed. No pertinent family history.  History  Substance Use Topics  . Smoking status: Never Smoker   . Smokeless tobacco: Not on file  . Alcohol Use: No    OB History    Grav Para Term Preterm Abortions TAB SAB Ect Mult Living                  Review of Systems  Gastrointestinal: Positive for abdominal pain.  Musculoskeletal:       Knee pain  All other systems reviewed  and are negative.    Allergies  Review of patient's allergies indicates no known allergies.  Home Medications   Current Outpatient Rx  Name  Route  Sig  Dispense  Refill  . ASPIRIN EC 81 MG PO TBEC   Oral   Take 81 mg by mouth daily.         Marland Kitchen OLMESARTAN MEDOXOMIL 40 MG PO TABS   Oral   Take 40 mg by mouth daily.         Marland Kitchen PANTOPRAZOLE SODIUM 40 MG PO TBEC   Oral   Take 40 mg by mouth daily.         Marland Kitchen SAXAGLIPTIN HCL 5 MG PO TABS   Oral   Take by mouth daily.           BP 136/78  Pulse 83  Temp 98.6 F (37 C) (Oral)  Resp 18  SpO2 98%  Physical Exam  Nursing note and vitals reviewed. Constitutional: She appears well-developed and well-nourished.  HENT:  Head: Normocephalic and atraumatic.  Eyes: Conjunctivae normal and EOM are normal. Pupils are equal, round, and reactive to light.  Neck: Normal range of motion. Neck supple.  Cardiovascular: Normal rate, regular rhythm, normal heart sounds and intact distal pulses.   Pulmonary/Chest: Effort normal and breath sounds normal. She exhibits tenderness.    Abdominal: Soft. Bowel sounds are normal.  Musculoskeletal: Normal range of motion.       She has a large contusion on the anterior aspect of the left knee. She has full, active range of motion. No crepitus.  Neurological: She is alert.  Skin: Skin is warm and dry.  Psychiatric: She has a normal mood and affect. Thought content normal.    ED Course  Procedures (including critical care time)  DIAGNOSTIC STUDIES: Oxygen Saturation is 98% on room air, normal by my interpretation.    COORDINATION OF CARE:  17:17- Discussed planned course of treatment with the patient, including a walker and pain medication who is agreeable at this time.    Labs Reviewed - No data to display Dg Ribs Unilateral W/chest Right  03/29/2012  *RADIOLOGY REPORT*  Clinical Data: Fall, pain.  RIGHT RIBS AND CHEST - 3+ VIEW  Comparison: 07/10/2010  Findings: Peribronchial  thickening and diffuse interstitial prominence suggesting bronchitis.  Heart is normal size.  No confluent airspace opacities or effusions.  No acute bony abnormality.  No visible rib fracture.  No pneumothorax.  IMPRESSION: Diffuse interstitial prominence and peribronchial thickening, likely chronic bronchitic changes.   Original Report Authenticated By: Charlett Nose, M.D.    Dg Knee Complete 4 Views Left  03/29/2012  *RADIOLOGY REPORT*  Clinical Data: Knee pain after fall.  LEFT KNEE - COMPLETE 4+ VIEW  Comparison: July 20, 2008.  Findings: No fracture or dislocation is noted.  Narrowing of the medial and lateral joint spaces is noted with osteophyte formation consistent with degenerative joint disease.  No joint effusion is noted.  IMPRESSION: Moderate degenerative joint disease.   Original Report Authenticated By: Lupita Raider.,  M.D.      No diagnosis found.   I personally performed the services described in this documentation, which was scribed in my presence. The recorded information has been reviewed and is accurate.   MDM  Patient cautioned to stay with family until moving better and to follow up with her pmd or Dr. Montez Morita next week if pain continues.        Hilario Quarry, MD 03/30/12 1330

## 2012-04-22 ENCOUNTER — Other Ambulatory Visit: Payer: Self-pay | Admitting: Orthopedic Surgery

## 2012-04-22 ENCOUNTER — Ambulatory Visit
Admission: RE | Admit: 2012-04-22 | Discharge: 2012-04-22 | Disposition: A | Discharge status: Home / Self Care | Payer: PRIVATE HEALTH INSURANCE | Source: Ambulatory Visit | Attending: Orthopedic Surgery | Admitting: Orthopedic Surgery

## 2012-04-22 DIAGNOSIS — M25562 Pain in left knee: Secondary | ICD-10-CM

## 2013-01-14 ENCOUNTER — Other Ambulatory Visit: Payer: Self-pay | Admitting: Radiology

## 2013-01-15 ENCOUNTER — Other Ambulatory Visit: Payer: Self-pay | Admitting: Radiology

## 2013-01-15 DIAGNOSIS — C50912 Malignant neoplasm of unspecified site of left female breast: Secondary | ICD-10-CM

## 2013-01-16 ENCOUNTER — Ambulatory Visit
Admission: RE | Admit: 2013-01-16 | Discharge: 2013-01-16 | Disposition: A | Discharge status: Home / Self Care | Payer: Medicare Other | Source: Ambulatory Visit | Attending: Radiology | Admitting: Radiology

## 2013-01-16 DIAGNOSIS — C50912 Malignant neoplasm of unspecified site of left female breast: Secondary | ICD-10-CM

## 2013-01-16 MED ORDER — GADOBENATE DIMEGLUMINE 529 MG/ML IV SOLN
8.0000 mL | Freq: Once | INTRAVENOUS | Status: AC | PRN
  Administered 2013-01-16: 8 mL via INTRAVENOUS

## 2013-01-24 ENCOUNTER — Encounter (INDEPENDENT_AMBULATORY_CARE_PROVIDER_SITE_OTHER): Payer: Self-pay | Admitting: General Surgery

## 2013-01-24 ENCOUNTER — Other Ambulatory Visit (INDEPENDENT_AMBULATORY_CARE_PROVIDER_SITE_OTHER): Payer: Self-pay | Admitting: General Surgery

## 2013-01-24 ENCOUNTER — Ambulatory Visit (INDEPENDENT_AMBULATORY_CARE_PROVIDER_SITE_OTHER): Payer: Medicaid Other | Admitting: General Surgery

## 2013-01-24 ENCOUNTER — Telehealth (INDEPENDENT_AMBULATORY_CARE_PROVIDER_SITE_OTHER): Payer: Self-pay | Admitting: General Surgery

## 2013-01-24 VITALS — BP 134/80 | HR 80 | Temp 98.0°F | Resp 18 | Ht 60.0 in | Wt 166.0 lb

## 2013-01-24 DIAGNOSIS — C50112 Malignant neoplasm of central portion of left female breast: Secondary | ICD-10-CM

## 2013-01-24 DIAGNOSIS — C50119 Malignant neoplasm of central portion of unspecified female breast: Secondary | ICD-10-CM | POA: Insufficient documentation

## 2013-01-24 NOTE — Progress Notes (Signed)
Patient ID: [name], female   DOB: 10/16/1920, 79 y.o.   MRN: [id number]  Chief Complaint  Patient presents with  . New Evaluation    breast ca    HPI [name] is a 79 y.o. female.  Referred by Dr Rogelia Mire HPI 31 yof who has prior history of left lumpectomy and snbx (I think by Dr Dominga Ferry) 18 years ago followed by xrt.  She does not recall anything about breast cancer other than this.  She did not receive chemotherapy or sound like she has taken antiestrogen therapy in past.  She lives alone and is very active.  She has a lot of family in town.  For a number of months she has noted a left breast mass with increasing pain and even some scabbing around her nipple.  She had this evaluated by Dr Tilda Burrow with [place] and was found to have mass correlating with palpable area.  This has undergone core biopsy and is invasive ductal carcinoma with lvi, triple negative.  She has also undergone mr which is below which shows likely radiation changes and large mass or conglomeration of masses that is consistent with her exam.  She comes in today with two of her granddaughters to discuss treatment options. Past Medical History  Diagnosis Date  . Hypertension   . Diabetes mellitus without complication     Past Surgical History  Procedure Laterality Date  . Cholecystectomy  2011  . Masty      life partical masty   . Shoulder open rotator cuff repair      Family History  Problem Relation Age of Onset  . Cancer Mother     breast ca  . Stroke Father   . Heart disease Father     Social History History  Substance Use Topics  . Smoking status: Never Smoker   . Smokeless tobacco: Not on file  . Alcohol Use: No    No Known Allergies  Current Outpatient Prescriptions  Medication Sig Dispense Refill  . amLODipine (NORVASC) 5 MG tablet       . aspirin EC 81 MG tablet Take 81 mg by mouth daily.      Marland Kitchen glucose blood test strip 1 each by Other route as needed for other. Use  as instructed      . saxagliptin HCl (ONGLYZA) 5 MG TABS tablet Take by mouth daily.      Marland Kitchen olmesartan (BENICAR) 40 MG tablet Take 40 mg by mouth daily.      . pantoprazole (PROTONIX) 40 MG tablet Take 40 mg by mouth daily.       No current facility-administered medications for this visit.    Review of Systems Review of Systems  Constitutional: Negative for fever, chills and unexpected weight change.  HENT: Positive for hearing loss. Negative for congestion, sore throat, trouble swallowing and voice change.   Eyes: Negative for visual disturbance.  Respiratory: Negative for cough and wheezing.   Cardiovascular: Negative for chest pain, palpitations and leg swelling.  Gastrointestinal: Positive for constipation. Negative for nausea, vomiting, abdominal pain, diarrhea, blood in stool, abdominal distention and anal bleeding.  Genitourinary: Negative for hematuria, vaginal bleeding and difficulty urinating.  Musculoskeletal: Negative for arthralgias.  Skin: Negative for rash and wound.  Neurological: Negative for seizures, syncope and headaches.  Hematological: Negative for adenopathy. Does not bruise/bleed easily.  Psychiatric/Behavioral: Negative for confusion.    Blood pressure 134/80, pulse 80, temperature 98 F (36.7 C), resp. rate 18, height  5' (1.524 m), weight 166 lb (75.297 kg).  Physical Exam Physical Exam  Vitals reviewed. Constitutional: She appears well-developed and well-nourished.  Neck: Neck supple.  Cardiovascular: Normal rate, regular rhythm and normal heart sounds.   Pulmonary/Chest: Effort normal and breath sounds normal. She has no wheezes. She has no rales. Right breast exhibits no inverted nipple, no mass, no nipple discharge, no skin change and no tenderness. Left breast exhibits inverted nipple, mass (large 5 cm subareolar tender mass with nipple inversion), skin change and tenderness. Left breast exhibits no nipple discharge.  Lymphadenopathy:    She has no  cervical adenopathy.    She has no axillary adenopathy.       Right: No supraclavicular adenopathy present.       Left: No supraclavicular adenopathy present.    Data Reviewed Mm/path/us reviewed BILATERAL BREAST MRI WITH AND WITHOUT CONTRAST  Technique: Multiplanar, multisequence MR images of both breasts  were obtained prior to and following the intravenous administration  of 8ml of Multihance.  THREE-DIMENSIONAL MR IMAGE RENDERING ON INDEPENDENT WORKSTATION:  Three-dimensional MR images were rendered by post-processing of  the original MR data on an independent DynaCad workstation. The  three-dimensional MR images were interpreted, and findings are  reported in the following complete MRI report for this study.  Comparison: Recent imaging examinations.  FINDINGS:  Breast composition: b. Scattered fibroglandular tissue  Background parenchymal enhancement: Mild  Right breast: No mass or abnormal enhancement.  Left breast: The left breast is diminished in size with diffuse T2  signal abnormality throughout the breast parenchyma. There is  marked left breast skin thickening. Centrally within the left  breast there are multiple large adjacent centrally necrotic  enhancing masses with the total conglomerate measuring 8 x 5 x 6  cm. The more central necrotic mass measures 5 x 4 x 4.5 cm with the  more anterior aspect of this large conglomerate in the immediate  retroareolar location measuring 1.9 x 1.7 x 1.8 cm. There is  associated left nipple retraction.  Lymph nodes: No abnormal appearing lymph nodes.  Ancillary findings: Multiple pulmonary nodules identified with the  largest in the right lung measuring 1.1 cm.  IMPRESSION:  1. Large conglomerate of adjacent centrally necrotic masses within  the left breast extending to the immediate retroareolar location  with left nipple retraction compatible with biopsy proven  malignancy. Extensive left breast edema and associated skin   thickening is concerning for left breast inflammatory carcinoma.  2. Incidentally identified pulmonary nodules, the largest of which  measures 1.1 cm. Further evaluation with chest CT is recommended.  RECOMMENDATION:  1. Treatment plan for left breast malignancy.  2. Further imaging evaluation of pulmonary nodules which may  represent metastatic disease.   Assessment    Locally advanced tn left breast cancer Concern for metastatic disease     Plan    PET scan, med onc appt and then will see back for final plan  I had discussion with patient and her 2 grandaughters.  This is new or recurrent cancer in her left breast after treatment with lumpectomy/I think snbx and xrt.  This is locally advanced and Im not sure right now that mastectomy is best option although I think we could do if needed.  closure and wound issues are problematic potentially.  I am most concerned about aggressive nature of tumor, time course it has been present and the abnormalities in her lungs on her breast mr.  We discussed her diagnosis and staging of  breast cancer.  I think more information on staging with a PET scan would be appropriate to direct [place] towards treatment recommendations.  I will also schedule her an appt to see medical oncology. We have discussed her at tumor board already.  I will see her back after that.  She would like to try and delay therapy until after what she says is her 2 th bday on sept 22.        Nathaniel Wakeley 01/24/2013, 10:56 AM

## 2013-01-24 NOTE — Telephone Encounter (Signed)
Spoke with pt and her family and explained that she is set up for her PET scan at [facility] radiology on 01/31/13 at 11:45.  Explained that she must be NPO after midnight.  They understood.

## 2013-01-31 ENCOUNTER — Telehealth: Payer: Self-pay | Admitting: *Deleted

## 2013-01-31 ENCOUNTER — Encounter (HOSPITAL_COMMUNITY)
Admission: RE | Admit: 2013-01-31 | Discharge: 2013-01-31 | Disposition: A | Discharge status: Home / Self Care | Payer: PRIVATE HEALTH INSURANCE | Source: Ambulatory Visit | Attending: General Surgery | Admitting: General Surgery

## 2013-01-31 DIAGNOSIS — C50112 Malignant neoplasm of central portion of left female breast: Secondary | ICD-10-CM

## 2013-01-31 DIAGNOSIS — C50119 Malignant neoplasm of central portion of unspecified female breast: Secondary | ICD-10-CM | POA: Insufficient documentation

## 2013-01-31 NOTE — Telephone Encounter (Signed)
Confirmed 02/07/13 appt w/ pt.  Mailed before appt letter & packet to pt.  Emailed Dr. Dwain Sarna at [facility] to make him aware.  Took paperwork to Med [facility] for chart.

## 2013-02-04 ENCOUNTER — Encounter (HOSPITAL_COMMUNITY): Payer: Self-pay

## 2013-02-04 ENCOUNTER — Encounter (HOSPITAL_COMMUNITY)
Admission: RE | Admit: 2013-02-04 | Discharge: 2013-02-04 | Disposition: A | Discharge status: Home / Self Care | Payer: PRIVATE HEALTH INSURANCE | Source: Ambulatory Visit | Attending: General Surgery | Admitting: General Surgery

## 2013-02-04 DIAGNOSIS — C50119 Malignant neoplasm of central portion of unspecified female breast: Secondary | ICD-10-CM | POA: Insufficient documentation

## 2013-02-04 LAB — GLUCOSE, CAPILLARY: Glucose-Capillary: 85 mg/dL (ref 70–99)

## 2013-02-04 MED ORDER — FLUDEOXYGLUCOSE F - 18 (FDG) INJECTION
14.3000 | Freq: Once | INTRAVENOUS | Status: AC | PRN
  Administered 2013-02-04: 14.3 via INTRAVENOUS

## 2013-02-07 ENCOUNTER — Other Ambulatory Visit: Payer: Self-pay | Admitting: Emergency Medicine

## 2013-02-07 ENCOUNTER — Other Ambulatory Visit (HOSPITAL_BASED_OUTPATIENT_CLINIC_OR_DEPARTMENT_OTHER): Payer: PRIVATE HEALTH INSURANCE | Admitting: Lab

## 2013-02-07 ENCOUNTER — Encounter: Payer: Self-pay | Admitting: Oncology

## 2013-02-07 ENCOUNTER — Telehealth: Payer: Self-pay | Admitting: *Deleted

## 2013-02-07 ENCOUNTER — Ambulatory Visit: Payer: Medicare Other

## 2013-02-07 ENCOUNTER — Ambulatory Visit (HOSPITAL_BASED_OUTPATIENT_CLINIC_OR_DEPARTMENT_OTHER): Payer: PRIVATE HEALTH INSURANCE | Admitting: Oncology

## 2013-02-07 VITALS — BP 173/97 | HR 97 | Temp 99.2°F | Resp 20 | Ht 60.0 in | Wt 165.8 lb

## 2013-02-07 DIAGNOSIS — C50119 Malignant neoplasm of central portion of unspecified female breast: Secondary | ICD-10-CM

## 2013-02-07 DIAGNOSIS — C50112 Malignant neoplasm of central portion of left female breast: Secondary | ICD-10-CM

## 2013-02-07 LAB — CBC WITH DIFFERENTIAL/PLATELET
BASO%: 0.7 % (ref 0.0–2.0)
Eosinophils Absolute: 0.1 10*3/uL (ref 0.0–0.5)
MCHC: 31.9 g/dL (ref 31.5–36.0)
MCV: 69.8 fL — ABNORMAL LOW (ref 79.5–101.0)
MONO%: 11.9 % (ref 0.0–14.0)
NEUT#: 4.1 10*3/uL (ref 1.5–6.5)
RBC: 6.86 10*6/uL — ABNORMAL HIGH (ref 3.70–5.45)
RDW: 17.4 % — ABNORMAL HIGH (ref 11.2–14.5)
WBC: 5.6 10*3/uL (ref 3.9–10.3)

## 2013-02-07 LAB — COMPREHENSIVE METABOLIC PANEL (CC13)
ALT: 11 U/L (ref 0–55)
AST: 26 U/L (ref 5–34)
Albumin: 3.9 g/dL (ref 3.5–5.0)
Alkaline Phosphatase: 66 U/L (ref 40–150)
Glucose: 95 mg/dl (ref 70–140)
Potassium: 4.5 mEq/L (ref 3.5–5.1)
Sodium: 138 mEq/L (ref 136–145)
Total Protein: 7.4 g/dL (ref 6.4–8.3)

## 2013-02-07 NOTE — Telephone Encounter (Signed)
appts made and printed...td 

## 2013-02-07 NOTE — Progress Notes (Signed)
[name] [id number] [birth year] 79 y.o. 02/07/2013 4:19 PM  CC  [name], MD [address] [place] [address] [name]  REASON FOR CONSULTATION:  79 year old female with diagnosis of triple-negative left breast cancer. Patient is seen in medical oncology for discussion of treatment options.  STAGE:   No matching staging information was found for the patient.  REFERRING PHYSICIAN: [name]  HISTORY OF PRESENT ILLNESS:  [name] is a 79 y.o. female.  With a prior history of left lumpectomy and sentinel lymph node biopsy 18 years ago followed by radiation therapy. About 4 months ago she noted a left breast mass with increasing pain and some scabbing around the nipple. She had evaluation performed by Dr. [name] at [facility]. There was a mass correlating with the palpable area. She underwent a core biopsy that showed invasive ductal carcinoma with lymphovascular invasion. It was triple negative. She underwent MRI that showed radiation changes and a large mass where conglomeration or masses. She was seen by [name] who discussed different treatment options with the patient including mastectomy which would be the best option. However he was concerned about wound closure. He also referred the patient to medical oncology for further evaluation and management   Past Medical History: Past Medical History  Diagnosis Date  . Hypertension   . Diabetes mellitus without complication     Past Surgical History: Past Surgical History  Procedure Laterality Date  . Cholecystectomy  2011  . Shoulder open rotator cuff repair    . Breast lumpectomy Left     with alnd, followed by xrt    Family History: Family History  Problem Relation Age of Onset  . Cancer Mother     breast ca  . Stroke Father   . Heart disease Father     Social History History  Substance Use Topics  . Smoking status: Never Smoker   .  Smokeless tobacco: Not on file  . Alcohol Use: No    Allergies: No Known Allergies  Current Medications: Current Outpatient Prescriptions  Medication Sig Dispense Refill  . amLODipine (NORVASC) 5 MG tablet       . aspirin EC 81 MG tablet Take 81 mg by mouth daily.      Marland Kitchen glucose blood test strip 1 each by Other route as needed for other. Use as instructed      . olmesartan (BENICAR) 40 MG tablet Take 40 mg by mouth daily.      . pantoprazole (PROTONIX) 40 MG tablet Take 40 mg by mouth daily.      . saxagliptin HCl (ONGLYZA) 5 MG TABS tablet Take by mouth daily.       No current facility-administered medications for this visit.    OB/GYN History:menarche at 14, menopause 50's, no HRT G6P6, first live 54  Fertility Discussion: n/a Prior History of Cancer: yes  Health Maintenance:  Colonoscopy yes Bone Density yes Last PAP smear no  ECOG PERFORMANCE STATUS: 1 - Symptomatic but completely ambulatory  Genetic Counseling/testing:no  REVIEW OF SYSTEMS:  A comprehensive review of systems was negative.  PHYSICAL EXAMINATION: Blood pressure 173/97, pulse 97, temperature 99.2 F (37.3 C), temperature source Oral, resp. rate 20, height 5' (1.524 m), weight 165 lb 12.8 oz (75.206 kg).  XBJ:YNWGN, no distress, well nourished and well developed SKIN: skin color, texture, turgor are normal HEAD: Normocephalic EYES: PERRLA, EOMI, Conjunctiva are pink and non-injected EARS: External ears normal OROPHARYNX:no exudate, no  erythema and lips, buccal mucosa, and tongue normal  NECK: no adenopathy LYMPH:  no hepatosplenomegaly BREAST:right breast normal without mass, skin or nipple changes or axillary nodes, abnormal mass palpable within the whole breast with minimal tenderness LUNGS: clear to auscultation  HEART: regular rate & rhythm ABDOMEN:abdomen soft, non-tender, normal bowel sounds and no masses or organomegaly BACK: Back symmetric, no curvature., No CVA  tenderness EXTREMITIES:no clubbing, no cyanosis  NEURO: alert & oriented x 3 with fluent speech, no focal motor/sensory deficits     STUDIES/RESULTS: Mr Breast Bilateral W Wo Contrast  01/16/2013   **ADDENDUM** CREATED: 01/16/2013 17:51:11  Addition to impression 1:  The left breast skin thickening does not definitely demonstrate enhancement and is likely sequelae of prior radiation therapy.  An inflammatory component is not entirely excluded.  **END ADDENDUM** SIGNED BY: Antonieta Loveless, M.D  01/16/2013   *RADIOLOGY REPORT*  Clinical Data:Patient with newly diagnosed to left breast malignancy.  BUN and creatinine were obtained on site at [facility] at [address] Results:  BUN 17 mg/dL,  Creatinine 1.5 mg/dL.  GFR: 40  BILATERAL BREAST MRI WITH AND WITHOUT CONTRAST  Technique: Multiplanar, multisequence MR images of both breasts were obtained prior to and following the intravenous administration of 8ml of Multihance.  THREE-DIMENSIONAL MR IMAGE RENDERING ON INDEPENDENT WORKSTATION: Three-dimensional MR images were rendered by post-processing  of the original MR data on an independent DynaCad workstation. The three-dimensional MR images were interpreted, and findings are reported in the following complete MRI report for this study.  Comparison:  Recent imaging examinations.  FINDINGS:  Breast composition:  b.  Scattered fibroglandular tissue  Background parenchymal enhancement: Mild  Right breast:  No mass or abnormal enhancement.  Left breast:  The left breast is diminished in size with diffuse T2 signal abnormality throughout the breast parenchyma.  There is marked left breast skin thickening. Centrally within the left breast there are multiple large adjacent centrally necrotic enhancing masses with the total conglomerate measuring 8 x 5 x 6 cm. The more central necrotic mass measures 5 x 4 x 4.5 cm with the more anterior aspect of this large conglomerate in the immediate retroareolar  location measuring 1.9 x 1.7 x 1.8 cm.  There is associated left nipple retraction.  Lymph nodes:  No abnormal appearing lymph nodes.  Ancillary findings:  Multiple pulmonary nodules identified with the largest in the right lung measuring 1.1 cm.  IMPRESSION: 1.  Large conglomerate of adjacent centrally necrotic masses within the left breast extending to the immediate retroareolar location with left nipple retraction compatible with biopsy proven malignancy.  Extensive left breast edema and associated skin thickening is concerning for left breast inflammatory carcinoma.  2.  Incidentally identified pulmonary nodules, the largest of which measures 1.1 cm.  Further evaluation with chest CT is recommended.  RECOMMENDATION:  1. Treatment plan for left breast malignancy.  2. Further imaging evaluation of pulmonary nodules which may represent metastatic disease.  BI-RADS CATEGORY 6:  Known biopsy-proven malignancy - appropriate action should be taken.   Original Report Authenticated By: Annia Belt, M.D   Nm Pet Image Initial (pi) Skull Base To Thigh  02/04/2013   CLINICAL DATA:  Initial treatment strategy for breast carcinoma. Marland Kitchen  EXAM: NUCLEAR MEDICINE PET SKULL BASE TO THIGH  FASTING BLOOD GLUCOSE:  Value:  85 mg/dl  TECHNIQUE: The mCi Z-61 FDG was injected intravenously. CT data was obtained and used for attenuation correction and anatomic localization only. (This was not acquired  as a diagnostic CT examination.) Additional exam technical data entered on technologist worksheet.  COMPARISON:  None  FINDINGS: NECK  No hypermetabolic lymph nodes in the neck.  CHEST  Nodule within the anterior superior right upper lobe measures 8 mm (image 51) with SUV max = 7.6. Second hypermetabolic nodule in the right upper lobe measures 9 mm (image 64) with SUV max = 5.9. No hypermetabolic mediastinal lymph nodes.  There is a rim of hypermetabolic activity at the surgical site in the left breast. There is hypermetabolic activity  within the skin surface of the left breast. No hypermetabolic left axillary lymph nodes. No hypermetabolic supraclavicular or infraclavicular nodes.  ABDOMEN/PELVIS  No abnormal hypermetabolic activity within the liver, pancreas, adrenal glands, or spleen. No hypermetabolic lymph nodes in the abdomen or pelvis.  SKELETON  There is severe endplate sclerosis at T11-T12 and T8 -T9 with minimal hypermetabolic activity is level suggests degenerative change. There is moderate uptake within the left and right posterior iliac bones (image 143) with moderate FDG activity SUV max = 6.1 . There is a focus of intense metabolic activity within the glenoid surface of the right scapula (image 25).  IMPRESSION: 1. Two hypermetabolic nodules in the right upper lobe are consistent with pulmonary metastasis.  2. Hypermetabolic lesion in the left breast with hypermetabolic skin thickening likely combination of tumor and postsurgical inflammation.  3. Concern for skeletal metastasis. There is a focal lesion in the right scapula and more diffuse uptake noted in the posterior left and right iliac bones.  4. Severe endplate changes in the lower thoracic spine there are likely degenerative. These are not significant changed from prior CT of 06/25/2010.   Electronically Signed   By: Genevive Bi M.D.   On: 02/04/2013 14:20     LABS:    Chemistry      Component Value Date/Time   NA 138 02/07/2013 1514   NA 139 07/12/2010 0338   K 4.5 02/07/2013 1514   K 3.5 07/12/2010 0338   CL 107 07/12/2010 0338   CO2 25 02/07/2013 1514   CO2 26 07/12/2010 0338   BUN 19.1 02/07/2013 1514   BUN 5* 07/12/2010 0338   CREATININE 1.5* 02/07/2013 1514   CREATININE 0.96 07/12/2010 0338      Component Value Date/Time   CALCIUM 9.4 02/07/2013 1514   CALCIUM 8.0* 07/12/2010 0338   ALKPHOS 66 02/07/2013 1514   ALKPHOS 90 07/10/2010 0540   AST 26 02/07/2013 1514   AST 60* 07/10/2010 0540   ALT 11 02/07/2013 1514   ALT 34 07/10/2010 0540   BILITOT 0.48  02/07/2013 1514   BILITOT 0.7 07/10/2010 0540      Lab Results  Component Value Date   WBC 5.6 02/07/2013   HGB 15.3 02/07/2013   HCT 47.9* 02/07/2013   MCV 69.8* 02/07/2013   PLT 408* 02/07/2013   PATHOLOGY: ADDITIONAL INFORMATION: PROGNOSTIC INDICATORS - ACIS Results: IMMUNOHISTOCHEMICAL AND MORPHOMETRIC ANALYSIS BY THE AUTOMATED CELLULAR IMAGING SYSTEM (ACIS) Estrogen Receptor: 0%, NEGATIVE Progesterone Receptor: 0%, NEGATIVE Proliferation Marker Ki67: 83% COMMENT: The negative hormone receptor study(ies) in this case have no internal positive control. REFERENCE RANGE ESTROGEN RECEPTOR NEGATIVE <1% POSITIVE =>1% PROGESTERONE RECEPTOR NEGATIVE <1% POSITIVE =>1% All controls stained appropriately Abigail Miyamoto MD Pathologist, Electronic Signature ( Signed 01/21/2013) CHROMOGENIC IN-SITU HYBRIDIZATION Results: HER-2/NEU BY CISH - NO AMPLIFICATION OF HER-2 DETECTED. RESULT RATIO OF HER2: CEP 17 SIGNALS 1.32 1 of 3 FINAL for Suderman, Pier A ([id number]) ADDITIONAL INFORMATION:(continued) AVERAGE HER2  COPY NUMBER PER CELL 2.25 REFERENCE RANGE NEGATIVE HER2/Chr17 Ratio <2.0 and Average HER2 copy number <4.0 EQUIVOCAL HER2/Chr17 Ratio <2.0 and Average HER2 copy number 4.0 and <6.0 POSITIVE HER2/Chr17 Ratio >=2.0 and/or Average HER2 copy number >=6.0 Abigail Miyamoto MD Pathologist, Electronic Signature ( Signed 01/17/2013) FINAL DIAGNOSIS Diagnosis Breast, left, needle core biopsy, mass - INVASIVE DUCTAL CARCINOMA, SEE COMMENT. - DUCTAL CARCINOMA IN SITU. - LYMPHOVASCULAR INVASION IDENTIFIED. Microscopic Comment There is extensive tumor necrosis present. Although the grade of tumor is best assessed at resection, with these biopsies, both in situ and invasive carcinoma are grade II-III. Breast prognostic studies are pending and will be reported in an addendum. The case was reviewed with Dr Colonel Bald, who concurs. (CR:kh 01-15-13) [place] RUND DO Pathologist, Electronic  Signature (Case signed 01/15/2013)  ASSESSMENT    79 year old female with  #1 new diagnosis of recurrent invasive ductal carcinoma that is triple negative in the left breast. She had prior lumpectomy and sentinel lymph node biopsy and radiation in the past and is ipsilateral breast. Patient has a high-risk tumor. She is undecided as to what she wants to do. I discussed with the patient the pathology and diagnosis of disease as well as treatment options. Certainly she should get a mastectomy up front but I do understand Dr. Doreen Salvage concerns of wound healing problems. I did discuss the possibility of giving her light chemotherapy such as dose reduced CMF to see if we could control this tumor/mass. Patient at this time is undecided.  #2 we also discussed doing PET scan to evaluate if there is any distant disease. If there is then certainly she may be a good candidate for just palliative care.  #3 patient her granddaughters as well as her pastor were at the appointment they'll demonstrated understanding of our discussions. Patient is going to turn 65 on September 22 and I will plan on seeing her back after  Clinical Trial Eligibility:no Multidisciplinary conference discussion no     PLAN:    1. PET/CT  2 follow up in 1 week        Discussion: Patient is being treated per NCCN breast cancer care guidelines appropriate for stage.III   Thank you so much for allowing me to participate in the care of [name]. I will continue to follow up the patient with you and assist in her care.  All questions were answered. The patient knows to call the clinic with any problems, questions or concerns. We can certainly see the patient much sooner if necessary.  I spent 40 minutes counseling the patient face to face. The total time spent in the appointment was 55 minutes.  Drue Second, MD Medical/Oncology [facility] [phone] (beeper) [phone] (Office)

## 2013-02-07 NOTE — Patient Instructions (Addendum)
We discussed the radiology and pathology.  Have triple-negative breast cancer with metastasis to the lung and bones on your PET scan that was performed.  We discussed the possibility of chemotherapy therapy. I have given information on it as noted below.  I will see you back on Tuesday, 02/10/2013 for followup.  Cyclophosphamide injection What is this medicine? CYCLOPHOSPHAMIDE (sye kloe FOSS fa mide) is a chemotherapy drug. It slows the growth of cancer cells. This medicine is used to treat many types of cancer like lymphoma, myeloma, leukemia, breast cancer, and ovarian cancer, to name a few. It is also used to treat nephrotic syndrome in children. This medicine may be used for other purposes; ask your health care provider or pharmacist if you have questions. What should I tell my health care provider before I take this medicine? They need to know if you have any of these conditions: -blood disorders -history of other chemotherapy -history of radiation therapy -infection -kidney disease -liver disease -tumors in the bone marrow -an unusual or allergic reaction to cyclophosphamide, other chemotherapy, other medicines, foods, dyes, or preservatives -pregnant or trying to get pregnant -breast-feeding How should I use this medicine? This drug is usually given as an injection into a vein or muscle or by infusion into a vein. It is administered in a hospital or clinic by a specially trained health care professional. Talk to your pediatrician regarding the use of this medicine in children. While this drug may be prescribed for selected conditions, precautions do apply. Overdosage: If you think you have taken too much of this medicine contact a poison control center or emergency room at once. NOTE: This medicine is only for you. Do not share this medicine with others. What if I miss a dose? It is important not to miss your dose. Call your doctor or health care professional if you are unable  to keep an appointment. What may interact with this medicine? Do not take this medicine with any of the following medications: -mibefradil -nalidixic acid This medicine may also interact with the following medications: -doxorubicin -etanercept -medicines to increase blood counts like filgrastim, pegfilgrastim, sargramostim -medicines that block muscle or nerve pain -St. John's Wort -phenobarbital -succinylcholine chloride -trastuzumab -vaccines Talk to your doctor or health care professional before taking any of these medicines: -acetaminophen -aspirin -ibuprofen -ketoprofen -naproxen This list may not describe all possible interactions. Give your health care provider a list of all the medicines, herbs, non-prescription drugs, or dietary supplements you use. Also tell them if you smoke, drink alcohol, or use illegal drugs. Some items may interact with your medicine. What should I watch for while using this medicine? Visit your doctor for checks on your progress. This drug may make you feel generally unwell. This is not uncommon, as chemotherapy can affect healthy cells as well as cancer cells. Report any side effects. Continue your course of treatment even though you feel ill unless your doctor tells you to stop. Drink water or other fluids as directed. Urinate often, even at night. In some cases, you may be given additional medicines to help with side effects. Follow all directions for their use. Call your doctor or health care professional for advice if you get a fever, chills or sore throat, or other symptoms of a cold or flu. Do not treat yourself. This drug decreases your body's ability to fight infections. Try to avoid being around people who are sick. This medicine may increase your risk to bruise or bleed. Call your doctor or  health care professional if you notice any unusual bleeding. Be careful brushing and flossing your teeth or using a toothpick because you may get an  infection or bleed more easily. If you have any dental work done, tell your dentist you are receiving this medicine. Avoid taking products that contain aspirin, acetaminophen, ibuprofen, naproxen, or ketoprofen unless instructed by your doctor. These medicines may hide a fever. Do not become pregnant while taking this medicine. Women should inform their doctor if they wish to become pregnant or think they might be pregnant. There is a potential for serious side effects to an unborn child. Talk to your health care professional or pharmacist for more information. Do not breast-feed an infant while taking this medicine. Men should inform their doctor if they wish to father a child. This medicine may lower sperm counts. If you are going to have surgery, tell your doctor or health care professional that you have taken this medicine. What side effects may I notice from receiving this medicine? Side effects that you should report to your doctor or health care professional as soon as possible: -allergic reactions like skin rash, itching or hives, swelling of the face, lips, or tongue -low blood counts - this medicine may decrease the number of white blood cells, red blood cells and platelets. You may be at increased risk for infections and bleeding. -signs of infection - fever or chills, cough, sore throat, pain or difficulty passing urine -signs of decreased platelets or bleeding - bruising, pinpoint red spots on the skin, black, tarry stools, blood in the urine -signs of decreased red blood cells - unusually weak or tired, fainting spells, lightheadedness -breathing problems -dark urine -mouth sores -pain, swelling, redness at site where injected -swelling of the ankles, feet, hands -trouble passing urine or change in the amount of urine -weight gain -yellowing of the eyes or skin Side effects that usually do not require medical attention (report to your doctor or health care professional if they  continue or are bothersome): -changes in nail or skin color -diarrhea -hair loss -loss of appetite -missed menstrual periods -nausea, vomiting -stomach pain This list may not describe all possible side effects. Call your doctor for medical advice about side effects. You may report side effects to FDA at 1-800-FDA-1088. Where should I keep my medicine? This drug is given in a hospital or clinic and will not be stored at home. NOTE: This sheet is a summary. It may not cover all possible information. If you have questions about this medicine, talk to your doctor, pharmacist, or health care provider.  2013, [employer]. (08/13/2007 2:32:25 PM)  Methotrexate injection What is this medicine? METHOTREXATE (METH oh TREX ate) is a chemotherapy drug. This medicine affects cells that are rapidly growing, such as cancer cells and cells in your mouth and stomach. It is used to treat many cancers and other medical conditions. It is used for leukemias, lymphomas, breast cancer, lung cancer, head and neck cancers, and other cancers. This medicine also works on the immune system and is commonly used to treat psoriasis and rheumatoid arthritis. This medicine may be used for other purposes; ask your health care provider or pharmacist if you have questions. What should I tell my health care provider before I take this medicine? They need to know if you have any of these conditions: -if you frequently drink alcohol containing drinks -infection (especially a virus infection such as chickenpox, cold sores, or herpes) -immune system problems -kidney disease -liver disease -low  blood counts, like platelets, red bloods, or white blood cells -lung disease -recent or ongoing radiation therapy -an unusual or allergic reaction to methotrexate, benzyl alcohol, other medicines, foods, dyes, or preservatives -pregnant or trying to get pregnant -breast-feeding How should I use this medicine? This drug is  given as an injection into a muscle or into a vein. It may also be given into the spinal fluid. It is administered in a hospital or clinic by a specially trained health care professional. Talk to your pediatrician regarding the use of this medicine in children. While this drug may be prescribed for selected conditions, precautions do apply. Overdosage: If you think you have taken too much of this medicine contact a poison control center or emergency room at once. NOTE: This medicine is only for you. Do not share this medicine with others. What if I miss a dose? It is important not to miss your dose. Call your doctor or health care professional if you are unable to keep an appointment. What may interact with this medicine? -antibiotics and other medicines for infections -aspirin and aspirin-like medicines including bismuth subsalicylate (Pepto-Bismol) -cisplatin -dapsone -folic acid in supplements or vitamins -mercaptopurine -NSAIDs, medicines for pain and inflammation, like ibuprofen or naproxen -pemetrexed -phenylbutazone -phenytoin -probenecid -pyrimethamine -theophylline -trimetrexate -vaccines This list may not describe all possible interactions. Give your health care provider a list of all the medicines, herbs, non-prescription drugs, or dietary supplements you use. Also tell them if you smoke, drink alcohol, or use illegal drugs. Some items may interact with your medicine. What should I watch for while using this medicine? Visit your doctor for checks on your progress. You will need to have regular blood checks during your treatment to monitor your blood, liver function, and kidney function. This drug may make you feel generally unwell. This is not uncommon, as chemotherapy can affect healthy cells as well as cancer cells. Report any side effects. Continue your course of treatment even though you feel ill unless your doctor tells you to stop. In some cases, you may be given additional  medicines to help with side effects. Follow all directions for their use. Call your doctor or health care professional for advice if you get a fever, chills or sore throat, or other symptoms of a cold or flu. Do not treat yourself. This drug decreases your body's ability to fight infections. Try to avoid being around people who are sick. This medicine may increase your risk to bruise or bleed. Call your doctor or health care professional if you notice any unusual bleeding. Be careful brushing and flossing your teeth or using a toothpick because you may get an infection or bleed more easily. If you have any dental work done, tell your dentist you are receiving this medicine. Avoid taking products that contain aspirin, acetaminophen, ibuprofen, naproxen, or ketoprofen unless instructed by your doctor. These medicines may hide a fever. This medicine can make you more sensitive to the sun. Keep out of the sun. If you cannot avoid being in the sun, wear protective clothing and use sunscreen. Do not use sun lamps or tanning beds/booths. Do not treat diarrhea with over the counter products. Contact your doctor if you have diarrhea. To protect your kidneys, drink water or other fluids as directed while you are taking this medicine. Do not drink alcohol-containing drinks while taking this medicine. Both alcohol and the medicine may cause damage to your liver. Men and women must use effective birth control while they are taking this  medicine. Do not become pregnant while taking this medicine. Women must continue using effective birth control for 1 full menstrual cycle after stopping this medicine. Tell your doctor right away if you think that you or your partner might be pregnant. There is a potential for serious side effects to an unborn child. Talk to your health care professional or pharmacist for more information. Do not breast-feed an infant while taking this medicine. Men must continue effective birth control  for 3 months after stopping this medicine. What side effects may I notice from receiving this medicine? Side effects that you should report to your doctor or health care professional as soon as possible: -allergic reactions like skin rash, itching or hives, swelling of the face, lips, or tongue -low blood counts - this medicine may decrease the number of white blood cells, red blood cells and platelets. You may be at increased risk for infections and bleeding. -signs of infection - fever or chills, cough, sore throat, pain or difficulty passing urine -signs of decreased platelets or bleeding - bruising, pinpoint red spots on the skin, black, tarry stools, blood in the urine -signs of decreased red blood cells - unusually weak or tired, fainting spells, lightheadedness -breathing problems, like a dry cough -changes in vision -confusion, not alert -diarrhea -mouth or throat sores or ulcers -problems with balance, talking, walking -redness, blistering, peeling or loosening of the skin, including inside the mouth -seizures -trouble passing urine or change in the amount of urine -vomiting -yellowing of the eyes or skin Side effects that usually do not require medical attention (report to your doctor or health care professional if they continue or are bothersome): -change in skin color -eye irritation -hair loss -headache -loss of appetite -nausea -stomach upset This list may not describe all possible side effects. Call your doctor for medical advice about side effects. You may report side effects to FDA at 1-800-FDA-1088. Where should I keep my medicine? This drug is given in a hospital or clinic and will not be stored at home. NOTE: This sheet is a summary. It may not cover all possible information. If you have questions about this medicine, talk to your doctor, pharmacist, or health care provider.  2012, [employer]. (11/14/2007 11:13:24 AM)   Fluorouracil, 5-FU  injection What is this medicine? FLUOROURACIL, 5-FU (flure oh YOOR a sil) is a chemotherapy drug. It slows the growth of cancer cells. This medicine is used to treat many types of cancer like breast cancer, colon or rectal cancer, pancreatic cancer, and stomach cancer. This medicine may be used for other purposes; ask your health care provider or pharmacist if you have questions. What should I tell my health care provider before I take this medicine? They need to know if you have any of these conditions: -blood disorders -dihydropyrimidine dehydrogenase (DPD) deficiency -infection (especially a virus infection such as chickenpox, cold sores, or herpes) -kidney disease -liver disease -malnourished, poor nutrition -recent or ongoing radiation therapy -an unusual or allergic reaction to fluorouracil, other chemotherapy, other medicines, foods, dyes, or preservatives -pregnant or trying to get pregnant -breast-feeding How should I use this medicine? This drug is given as an infusion or injection into a vein. It is administered in a hospital or clinic by a specially trained health care professional. Talk to your pediatrician regarding the use of this medicine in children. Special care may be needed. Overdosage: If you think you have taken too much of this medicine contact a poison control center or emergency room  at once. NOTE: This medicine is only for you. Do not share this medicine with others. What if I miss a dose? It is important not to miss your dose. Call your doctor or health care professional if you are unable to keep an appointment. What may interact with this medicine? -allopurinol -cimetidine -dapsone -digoxin -hydroxyurea -leucovorin -levamisole -medicines for seizures like ethotoin, fosphenytoin, phenytoin -medicines to increase blood counts like filgrastim, pegfilgrastim, sargramostim -medicines that treat or prevent blood clots like warfarin, enoxaparin, and  dalteparin -methotrexate -metronidazole -pyrimethamine -some other chemotherapy drugs like busulfan, cisplatin, estramustine, vinblastine -trimethoprim -trimetrexate -vaccines Talk to your doctor or health care professional before taking any of these medicines: -acetaminophen -aspirin -ibuprofen -ketoprofen -naproxen This list may not describe all possible interactions. Give your health care provider a list of all the medicines, herbs, non-prescription drugs, or dietary supplements you use. Also tell them if you smoke, drink alcohol, or use illegal drugs. Some items may interact with your medicine. What should I watch for while using this medicine? Visit your doctor for checks on your progress. This drug may make you feel generally unwell. This is not uncommon, as chemotherapy can affect healthy cells as well as cancer cells. Report any side effects. Continue your course of treatment even though you feel ill unless your doctor tells you to stop. In some cases, you may be given additional medicines to help with side effects. Follow all directions for their use. Call your doctor or health care professional for advice if you get a fever, chills or sore throat, or other symptoms of a cold or flu. Do not treat yourself. This drug decreases your body's ability to fight infections. Try to avoid being around people who are sick. This medicine may increase your risk to bruise or bleed. Call your doctor or health care professional if you notice any unusual bleeding. Be careful brushing and flossing your teeth or using a toothpick because you may get an infection or bleed more easily. If you have any dental work done, tell your dentist you are receiving this medicine. Avoid taking products that contain aspirin, acetaminophen, ibuprofen, naproxen, or ketoprofen unless instructed by your doctor. These medicines may hide a fever. Do not become pregnant while taking this medicine. Women should inform their  doctor if they wish to become pregnant or think they might be pregnant. There is a potential for serious side effects to an unborn child. Talk to your health care professional or pharmacist for more information. Do not breast-feed an infant while taking this medicine. Men should inform their doctor if they wish to father a child. This medicine may lower sperm counts. Do not treat diarrhea with over the counter products. Contact your doctor if you have diarrhea that lasts more than 2 days or if it is severe and watery. This medicine can make you more sensitive to the sun. Keep out of the sun. If you cannot avoid being in the sun, wear protective clothing and use sunscreen. Do not use sun lamps or tanning beds/booths. What side effects may I notice from receiving this medicine? Side effects that you should report to your doctor or health care professional as soon as possible: -allergic reactions like skin rash, itching or hives, swelling of the face, lips, or tongue -low blood counts - this medicine may decrease the number of white blood cells, red blood cells and platelets. You may be at increased risk for infections and bleeding. -signs of infection - fever or chills, cough, sore throat,  pain or difficulty passing urine -signs of decreased platelets or bleeding - bruising, pinpoint red spots on the skin, black, tarry stools, blood in the urine -signs of decreased red blood cells - unusually weak or tired, fainting spells, lightheadedness -breathing problems -changes in vision -chest pain -mouth sores -nausea and vomiting -pain, swelling, redness at site where injected -pain, tingling, numbness in the hands or feet -redness, swelling, or sores on hands or feet -stomach pain -unusual bleeding Side effects that usually do not require medical attention (report to your doctor or health care professional if they continue or are bothersome): -changes in finger or toe nails -diarrhea -dry or itchy  skin -hair loss -headache -loss of appetite -sensitivity of eyes to the light -stomach upset -unusually teary eyes This list may not describe all possible side effects. Call your doctor for medical advice about side effects. You may report side effects to FDA at 1-800-FDA-1088. Where should I keep my medicine? This drug is given in a hospital or clinic and will not be stored at home. NOTE: This sheet is a summary. It may not cover all possible information. If you have questions about this medicine, talk to your doctor, pharmacist, or health care provider.  2012, [employer]. (09/11/2007 1:53:16 PM)

## 2013-02-11 ENCOUNTER — Ambulatory Visit (HOSPITAL_BASED_OUTPATIENT_CLINIC_OR_DEPARTMENT_OTHER): Payer: PRIVATE HEALTH INSURANCE | Admitting: Adult Health

## 2013-02-11 ENCOUNTER — Ambulatory Visit (HOSPITAL_COMMUNITY)
Admission: RE | Admit: 2013-02-11 | Discharge: 2013-02-11 | Disposition: A | Discharge status: Home / Self Care | Payer: PRIVATE HEALTH INSURANCE | Source: Ambulatory Visit | Attending: Adult Health | Admitting: Adult Health

## 2013-02-11 ENCOUNTER — Telehealth: Payer: Self-pay | Admitting: Adult Health

## 2013-02-11 ENCOUNTER — Encounter: Payer: Self-pay | Admitting: Adult Health

## 2013-02-11 VITALS — BP 155/83 | HR 108 | Temp 97.9°F | Resp 20 | Ht 60.0 in | Wt 164.0 lb

## 2013-02-11 DIAGNOSIS — R05 Cough: Secondary | ICD-10-CM

## 2013-02-11 DIAGNOSIS — C7800 Secondary malignant neoplasm of unspecified lung: Secondary | ICD-10-CM

## 2013-02-11 DIAGNOSIS — C50119 Malignant neoplasm of central portion of unspecified female breast: Secondary | ICD-10-CM

## 2013-02-11 DIAGNOSIS — R059 Cough, unspecified: Secondary | ICD-10-CM | POA: Insufficient documentation

## 2013-02-11 MED ORDER — ALBUTEROL SULFATE HFA 108 (90 BASE) MCG/ACT IN AERS: 2.0000 | INHALATION_SPRAY | Freq: Four times a day (QID) | RESPIRATORY_TRACT | Status: DC | PRN

## 2013-02-11 MED ORDER — LEVOFLOXACIN 250 MG PO TABS: 250.0000 mg | ORAL_TABLET | Freq: Every day | ORAL | Status: DC

## 2013-02-11 NOTE — Progress Notes (Addendum)
[name] [id number] [birth year] 79 y.o. 02/12/2013 2:26 PM  CC  [name], MD [address] [place] [address] [name]  DIAGNOSIS: 79 year old female with diagnosis of triple-negative left breast cancer.  PRIOR HISTORY:  [name] is a 79 y.o. female.  With a prior history of left lumpectomy and sentinel lymph node biopsy 18 years ago followed by radiation therapy. About 4 months ago she noted a left breast mass with increasing pain and some scabbing around the nipple. She had evaluation performed by Dr. [name] at [facility]. There was a mass correlating with the palpable area. She underwent a core biopsy that showed invasive ductal carcinoma with lymphovascular invasion. It was triple negative. She underwent MRI that showed radiation changes and a large mass where conglomeration or masses. She was seen by [name] who discussed different treatment options with the patient including mastectomy which would be the best option. However he was concerned about wound closure. He also referred the patient to medical oncology for further evaluation and management.  INTERVAL HISTORY: Is returning for treatment plans after having a PET/CT to evaluate the status of her triple negative breast cancer.  She is c/o of a severe cold and cough today.  This started over one week ago.  She has nasal drainage, a deep productive cough of yellow sputum.  She denies fevers, chest pain, shortness of breath, pleurisy.     Past Medical History: Past Medical History  Diagnosis Date  . Hypertension   . Diabetes mellitus without complication     Past Surgical History: Past Surgical History  Procedure Laterality Date  . Cholecystectomy  2011  . Shoulder open rotator cuff repair    . Breast lumpectomy Left     with alnd, followed by xrt    Family History: Family History  Problem Relation Age of Onset  . Cancer Mother     breast ca  . Stroke  Father   . Heart disease Father     Social History History  Substance Use Topics  . Smoking status: Never Smoker   . Smokeless tobacco: Not on file  . Alcohol Use: No    Allergies: No Known Allergies  Current Medications: Current Outpatient Prescriptions  Medication Sig Dispense Refill  . Aclidinium Bromide (TUDORZA PRESSAIR) 400 MCG/ACT AEPB Inhale 1 Inhaler into the lungs 2 (two) times daily.      Marland Kitchen amLODipine (NORVASC) 5 MG tablet       . aspirin EC 81 MG tablet Take 81 mg by mouth daily.      . saxagliptin HCl (ONGLYZA) 5 MG TABS tablet Take by mouth daily.      Marland Kitchen albuterol (PROVENTIL HFA;VENTOLIN HFA) 108 (90 BASE) MCG/ACT inhaler Inhale 2 puffs into the lungs every 6 (six) hours as needed for wheezing.  1 Inhaler  2  . glucose blood test strip 1 each by Other route as needed for other. Use as instructed      . levofloxacin (LEVAQUIN) 250 MG tablet Take 1 tablet (250 mg total) by mouth daily.  10 tablet  0  . olmesartan (BENICAR) 40 MG tablet Take 40 mg by mouth daily.      . pantoprazole (PROTONIX) 40 MG tablet Take 40 mg by mouth daily.       No current facility-administered medications for this visit.    REVIEW OF SYSTEMS:  A 10 point review of systems was conducted and is otherwise negative except for what  is noted above.     PHYSICAL EXAMINATION: Blood pressure 155/83, pulse 108, temperature 97.9 F (36.6 C), temperature source Oral, resp. rate 20, height 5' (1.524 m), weight 164 lb (74.39 kg). General: Patient is a well appearing female in no acute distress HEENT: PERRLA, sclerae anicteric no conjunctival pallor, MMM Neck: supple, no palpable adenopathy Lungs: rhonchi in bilateral lobes throughout, wheezing in bilateral upper lobes.   Cardiovascular: regular rate rhythm, S1, S2, no murmurs, rubs or gallops Abdomen: Soft, non-tender, non-distended, normoactive bowel sounds, no HSM Extremities: warm and well perfused, no clubbing, cyanosis, or edema Skin: No  rashes or lesions Neuro: Non-focal ECOG: 2     STUDIES/RESULTS: Mr Breast Bilateral W Wo Contrast  01/16/2013   **ADDENDUM** CREATED: 01/16/2013 17:51:11  Addition to impression 1:  The left breast skin thickening does not definitely demonstrate enhancement and is likely sequelae of prior radiation therapy.  An inflammatory component is not entirely excluded.  **END ADDENDUM** SIGNED BY: Antonieta Loveless, M.D  01/16/2013   *RADIOLOGY REPORT*  Clinical Data:Patient with newly diagnosed to left breast malignancy.  BUN and creatinine were obtained on site at [facility] at [address] Results:  BUN 17 mg/dL,  Creatinine 1.5 mg/dL.  GFR: 40  BILATERAL BREAST MRI WITH AND WITHOUT CONTRAST  Technique: Multiplanar, multisequence MR images of both breasts were obtained prior to and following the intravenous administration of 8ml of Multihance.  THREE-DIMENSIONAL MR IMAGE RENDERING ON INDEPENDENT WORKSTATION: Three-dimensional MR images were rendered by post-processing  of the original MR data on an independent DynaCad workstation. The three-dimensional MR images were interpreted, and findings are reported in the following complete MRI report for this study.  Comparison:  Recent imaging examinations.  FINDINGS:  Breast composition:  b.  Scattered fibroglandular tissue  Background parenchymal enhancement: Mild  Right breast:  No mass or abnormal enhancement.  Left breast:  The left breast is diminished in size with diffuse T2 signal abnormality throughout the breast parenchyma.  There is marked left breast skin thickening. Centrally within the left breast there are multiple large adjacent centrally necrotic enhancing masses with the total conglomerate measuring 8 x 5 x 6 cm. The more central necrotic mass measures 5 x 4 x 4.5 cm with the more anterior aspect of this large conglomerate in the immediate retroareolar location measuring 1.9 x 1.7 x 1.8 cm.  There is associated left nipple retraction.  Lymph  nodes:  No abnormal appearing lymph nodes.  Ancillary findings:  Multiple pulmonary nodules identified with the largest in the right lung measuring 1.1 cm.  IMPRESSION: 1.  Large conglomerate of adjacent centrally necrotic masses within the left breast extending to the immediate retroareolar location with left nipple retraction compatible with biopsy proven malignancy.  Extensive left breast edema and associated skin thickening is concerning for left breast inflammatory carcinoma.  2.  Incidentally identified pulmonary nodules, the largest of which measures 1.1 cm.  Further evaluation with chest CT is recommended.  RECOMMENDATION:  1. Treatment plan for left breast malignancy.  2. Further imaging evaluation of pulmonary nodules which may represent metastatic disease.  BI-RADS CATEGORY 6:  Known biopsy-proven malignancy - appropriate action should be taken.   Original Report Authenticated By: Annia Belt, M.D   Nm Pet Image Initial (pi) Skull Base To Thigh  02/04/2013   CLINICAL DATA:  Initial treatment strategy for breast carcinoma. Marland Kitchen  EXAM: NUCLEAR MEDICINE PET SKULL BASE TO THIGH  FASTING BLOOD GLUCOSE:  Value:  85 mg/dl  TECHNIQUE: The mCi F-18 FDG was injected intravenously. CT data was obtained and used for attenuation correction and anatomic localization only. (This was not acquired as a diagnostic CT examination.) Additional exam technical data entered on technologist worksheet.  COMPARISON:  None  FINDINGS: NECK  No hypermetabolic lymph nodes in the neck.  CHEST  Nodule within the anterior superior right upper lobe measures 8 mm (image 51) with SUV max = 7.6. Second hypermetabolic nodule in the right upper lobe measures 9 mm (image 64) with SUV max = 5.9. No hypermetabolic mediastinal lymph nodes.  There is a rim of hypermetabolic activity at the surgical site in the left breast. There is hypermetabolic activity within the skin surface of the left breast. No hypermetabolic left axillary lymph nodes. No  hypermetabolic supraclavicular or infraclavicular nodes.  ABDOMEN/PELVIS  No abnormal hypermetabolic activity within the liver, pancreas, adrenal glands, or spleen. No hypermetabolic lymph nodes in the abdomen or pelvis.  SKELETON  There is severe endplate sclerosis at T11-T12 and T8 -T9 with minimal hypermetabolic activity is level suggests degenerative change. There is moderate uptake within the left and right posterior iliac bones (image 143) with moderate FDG activity SUV max = 6.1 . There is a focus of intense metabolic activity within the glenoid surface of the right scapula (image 25).  IMPRESSION: 1. Two hypermetabolic nodules in the right upper lobe are consistent with pulmonary metastasis.  2. Hypermetabolic lesion in the left breast with hypermetabolic skin thickening likely combination of tumor and postsurgical inflammation.  3. Concern for skeletal metastasis. There is a focal lesion in the right scapula and more diffuse uptake noted in the posterior left and right iliac bones.  4. Severe endplate changes in the lower thoracic spine there are likely degenerative. These are not significant changed from prior CT of 06/25/2010.   Electronically Signed   By: Genevive Bi M.D.   On: 02/04/2013 14:20     LABS:    Chemistry      Component Value Date/Time   NA 138 02/07/2013 1514   NA 139 07/12/2010 0338   K 4.5 02/07/2013 1514   K 3.5 07/12/2010 0338   CL 107 07/12/2010 0338   CO2 25 02/07/2013 1514   CO2 26 07/12/2010 0338   BUN 19.1 02/07/2013 1514   BUN 5* 07/12/2010 0338   CREATININE 1.5* 02/07/2013 1514   CREATININE 0.96 07/12/2010 0338      Component Value Date/Time   CALCIUM 9.4 02/07/2013 1514   CALCIUM 8.0* 07/12/2010 0338   ALKPHOS 66 02/07/2013 1514   ALKPHOS 90 07/10/2010 0540   AST 26 02/07/2013 1514   AST 60* 07/10/2010 0540   ALT 11 02/07/2013 1514   ALT 34 07/10/2010 0540   BILITOT 0.48 02/07/2013 1514   BILITOT 0.7 07/10/2010 0540      Lab Results  Component Value Date    WBC 5.6 02/07/2013   HGB 15.3 02/07/2013   HCT 47.9* 02/07/2013   MCV 69.8* 02/07/2013   PLT 408* 02/07/2013   PATHOLOGY: ADDITIONAL INFORMATION: PROGNOSTIC INDICATORS - ACIS Results: IMMUNOHISTOCHEMICAL AND MORPHOMETRIC ANALYSIS BY THE AUTOMATED CELLULAR IMAGING SYSTEM (ACIS) Estrogen Receptor: 0%, NEGATIVE Progesterone Receptor: 0%, NEGATIVE Proliferation Marker Ki67: 83% COMMENT: The negative hormone receptor study(ies) in this case have no internal positive control. REFERENCE RANGE ESTROGEN RECEPTOR NEGATIVE <1% POSITIVE =>1% PROGESTERONE RECEPTOR NEGATIVE <1% POSITIVE =>1% All controls stained appropriately Abigail Miyamoto MD Pathologist, Electronic Signature ( Signed 01/21/2013) CHROMOGENIC IN-SITU HYBRIDIZATION Results: HER-2/NEU BY CISH - NO  AMPLIFICATION OF HER-2 DETECTED. RESULT RATIO OF HER2: CEP 17 SIGNALS 1.32 1 of 3 FINAL for Enge, Christine A ([id number]) ADDITIONAL INFORMATION:(continued) AVERAGE HER2 COPY NUMBER PER CELL 2.25 REFERENCE RANGE NEGATIVE HER2/Chr17 Ratio <2.0 and Average HER2 copy number <4.0 EQUIVOCAL HER2/Chr17 Ratio <2.0 and Average HER2 copy number 4.0 and <6.0 POSITIVE HER2/Chr17 Ratio >=2.0 and/or Average HER2 copy number >=6.0 Abigail Miyamoto MD Pathologist, Electronic Signature ( Signed 01/17/2013) FINAL DIAGNOSIS Diagnosis Breast, left, needle core biopsy, mass - INVASIVE DUCTAL CARCINOMA, SEE COMMENT. - DUCTAL CARCINOMA IN SITU. - LYMPHOVASCULAR INVASION IDENTIFIED. Microscopic Comment There is extensive tumor necrosis present. Although the grade of tumor is best assessed at resection, with these biopsies, both in situ and invasive carcinoma are grade II-III. Breast prognostic studies are pending and will be reported in an addendum. The case was reviewed with Dr Colonel Bald, who concurs. (CR:kh 01-15-13) [place] RUND DO Pathologist, Electronic Signature (Case signed 01/15/2013)  ASSESSMENT    79 year old female with  #1 new  diagnosis of recurrent invasive ductal carcinoma that is triple negative in the left breast. She had prior lumpectomy and sentinel lymph node biopsy and radiation in the past and is ipsilateral breast. Patient has a high-risk tumor. She is undecided as to what she wants to do. I discussed with the patient the pathology and diagnosis of disease as well as treatment options. Certainly she should get a mastectomy up front but I do understand Dr. Doreen Salvage concerns of wound healing problems. I did discuss the possibility of giving her light chemotherapy such as dose reduced CMF to see if we could control this tumor/mass. Patient at this time is undecided.  #2 PET demonstrated metastatic disease .   PLAN:  1.  I gave the patient her PET/CT results and informed her of the metastatic disease and she has decided to undergo palliative care.  We discussed Xgeva, and I gave her the literature and she will consider it.    2 She will follow up with [place] in about 3 months for evaluation.  3. I prescribed Levaquin 250 mg PO daily and Albuterol for likely pneumonia, I also ordered a chest x ray and reviewed with her reasons to call our office or go to the emergency room.    I gave her detailed information on Levaquin and Albuterol in her AVS.    All questions were answered. The patient knows to call the clinic with any problems, questions or concerns. We can certainly see the patient much sooner if necessary.  I spent 40 minutes counseling the patient face to face. The total time spent in the appointment was 55 minutes.  Cherie Ouch Lyn Hollingshead, NP Medical Oncology [facility] Phone: [phone]  ATTENDING'S ATTESTATION:  I personally reviewed patient's chart, examined patient myself, formulated the treatment plan as followed.    Patient had PET/CT performed and she indeed does have metastatic disease. Patient also has informed me that she does not want any chemotherapy. She does have a  little bit of a cough patient could possibly have pneumonia and she was given Levaquin as well as albuterol for this and we have ordered a chest x-ray. Patient will be referred to hospice palliative care we discussed this extensively. They are familiar with the services that are provided by hospice. I will plan on seeing her in 3 months time or sooner if need arises.  Drue Second, MD Medical/Oncology [facility] [phone] (beeper) [phone] (Office)  02/19/2013, 1:04 PM

## 2013-02-11 NOTE — Telephone Encounter (Signed)
m, °

## 2013-02-11 NOTE — Patient Instructions (Addendum)
Albuterol inhalation aerosol What is this medicine? ALBUTEROL (al Gaspar Bidding) is a bronchodilator. It helps open up the airways in your lungs to make it easier to breathe. This medicine is used to treat and to prevent bronchospasm. This medicine may be used for other purposes; ask your health care provider or pharmacist if you have questions. What should I tell my health care provider before I take this medicine? They need to know if you have any of the following conditions: -diabetes -heart disease or irregular heartbeat -high blood pressure -pheochromocytoma -seizures -thyroid disease -an unusual or allergic reaction to albuterol, levalbuterol, sulfites, other medicines, foods, dyes, or preservatives -pregnant or trying to get pregnant -breast-feeding How should I use this medicine? This medicine is for inhalation through the mouth. Follow the directions on your prescription label. Take your medicine at regular intervals. Do not use more often than directed. Make sure that you are using your inhaler correctly. Ask you doctor or health care provider if you have any questions. Use this medicine before you use any other inhaler. Wait 5 minutes or more before between using different inhalers. Talk to your pediatrician regarding the use of this medicine in children. Special care may be needed. Overdosage: If you think you have taken too much of this medicine contact a poison control center or emergency room at once. NOTE: This medicine is only for you. Do not share this medicine with others. What if I miss a dose? If you miss a dose, use it as soon as you can. If it is almost time for your next dose, use only that dose. Do not use double or extra doses. What may interact with this medicine? -anti-infectives like chloroquine and pentamidine -caffeine -cisapride -diuretics -medicines for colds -medicines for depression or for emotional or psychotic conditions -medicines for weight loss  including some herbal products -methadone -some antibiotics like clarithromycin, erythromycin, levofloxacin, and linezolid -some heart medicines -steroid hormones like dexamethasone, cortisone, hydrocortisone -theophylline -thyroid hormones This list may not describe all possible interactions. Give your health care provider a list of all the medicines, herbs, non-prescription drugs, or dietary supplements you use. Also tell them if you smoke, drink alcohol, or use illegal drugs. Some items may interact with your medicine. What should I watch for while using this medicine? Tell your doctor or health care professional if your symptoms do not improve. Do not use extra albuterol. If your asthma or bronchitis gets worse while you are using this medicine, call your doctor right away. If your mouth gets dry try chewing sugarless gum or sucking hard candy. Drink water as directed. What side effects may I notice from receiving this medicine? Side effects that you should report to your doctor or health care professional as soon as possible: -allergic reactions like skin rash, itching or hives, swelling of the face, lips, or tongue -breathing problems -chest pain -feeling faint or lightheaded, falls -high blood pressure -irregular heartbeat -fever -muscle cramps or weakness -pain, tingling, numbness in the hands or feet -vomiting Side effects that usually do not require medical attention (report to your doctor or health care professional if they continue or are bothersome): -cough -difficulty sleeping -headache -nervousness or trembling -stomach upset -stuffy or runny nose -throat irritation -unusual taste This list may not describe all possible side effects. Call your doctor for medical advice about side effects. You may report side effects to FDA at 1-800-FDA-1088. Where should I keep my medicine? Keep out of the reach of children. Store at  room temperature between 15 and 30 degrees C (59  and 86 degrees F). The contents are under pressure and may burst when exposed to heat or flame. Do not freeze. This medicine does not work as well if it is too cold. Throw away any unused medicine after the expiration date. Inhalers need to be thrown away after the labeled number of puffs have been used or by the expiration date; whichever comes first. Ventolin HFA should be thrown away 12 months after removing from foil pouch. Check the instructions that come with your medicine. NOTE: This sheet is a summary. It may not cover all possible information. If you have questions about this medicine, talk to your doctor, pharmacist, or health care provider.  2013, [employer]. (09/23/2010 11:00:52 AM) Levofloxacin oral solution What is this medicine? LEVOFLOXACIN (lee voe FLOX a sin) is a quinolone antibiotic. It is used to treat certain kinds of bacterial infections. It will not work for colds, flu, or other viral infections. This medicine may be used for other purposes; ask your health care provider or pharmacist if you have questions. What should I tell my health care provider before I take this medicine? They need to know if you have any of these conditions: -bone problems -cerebral disease -history of low levels of potassium in the blood -irregular heartbeat -joint problems -kidney disease -myasthenia gravis -seizure disorder -tendon problems -an unusual or allergic reaction to levofloxacin, other quinolone antibiotics, foods, dyes, or preservatives -pregnant or trying to get pregnant -breast-feeding How should I use this medicine? Take this medicine by mouth 1 hour before, or 2 hours after eating. Follow the directions on the prescription label. Use a specially marked spoon to measure the dose. Household spoons are not accurate. Take the medicine at the same times each day. Finish all of the medicine even if you think you are better. A special MedGuide will be given to you by the  pharmacist with each prescription and refill. Be sure to read this information carefully each time. Talk to your pediatrician regarding the use of this medicine in children. While this drug may be prescribed for children as young as 6 months for selected conditions, precautions do apply. Overdosage: If you think you have taken too much of this medicine contact a poison control center or emergency room at once. NOTE: This medicine is only for you. Do not share this medicine with others. What if I miss a dose? If you miss a dose, take it as soon as you can. If it is almost time for your next dose, take only that dose. Do not take double or extra doses. What may interact with this medicine? Do not take this medicine with any of the following medications: - arsenic trioxide - chloroquine - droperidol - medicines for irregular heart rhythm like amiodarone, disopyramide, dofetilide, flecainide, quinidine, procainamide, sotalol - some medicines for depression or mental problems like phenothiazines, pimozide, and ziprasidone This medicine may also interact with the following medications: - amoxapine -antacids - cisapride - dairy products - didanosine (ddI) buffered tablets or powder - haloperidol - multivitamins -NSAIDS, medicines for pain and inflammation, like ibuprofen or naproxen - retinoid products like tretinoin or isotretinoin - risperidone - some other antibiotics like clarithromycin or erythromycin - sucralfate - theophylline - warfarin This list may not describe all possible interactions. Give your health care provider a list of all the medicines, herbs, non-prescription drugs, or dietary supplements you use. Also tell them if you smoke, drink alcohol, or use  illegal drugs. Some items may interact with your medicine. What should I watch for while using this medicine? Tell your doctor or health care professional if your symptoms do not begin to improve in a few days.  Drink several glasses of water a day and cut down on drinks that contain caffeine. You must not get dehydrated. You may get drowsy or dizzy. Do not drive, use machinery, or do anything that needs mental alertness until you know how this medicine affects you. Do not stand or sit up quickly, especially if you are an older patient. This reduces the risk of dizzy or fainting spells. This medicine can make you more sensitive to the sun. Keep out of the sun. If you cannot avoid being in the sun, wear protective clothing and use a sunscreen. Do not use sun lamps or tanning beds/booths. Contact your doctor if you get a sunburn. If you are a diabetic monitor your blood glucose carefully. If you get an unusual reading stop taking this medicine and call your doctor right away. Do not treat diarrhea with over-the-counter products. Contact your doctor if you have diarrhea that lasts more than 2 days or if the diarrhea is severe and watery. Avoid antacids, calcium, iron, and zinc products for 2 hours before and 2 hours after taking a dose of this medicine. What side effects may I notice from receiving this medicine? Side effects that you should report to your doctor or health care professional as soon as possible: -allergic reactions like skin rash or hives, swelling of the face, lips, or tongue -changes in vision -confusion, nightmares or hallucinations -difficulty breathing -irregular heartbeat, chest pain -joint, muscle or tendon pain -pain or difficulty passing urine -persistent headache with or without blurred vision -redness, blistering, peeling or loosening of the skin, including inside the mouth -seizures -unusual pain, numbness, tingling, or weakness -vaginal irritation, discharge  Side effects that usually do not require medical attention (report to your doctor or health care professional if they continue or are bothersome): -diarrhea -dry mouth -headache -stomach upset, nausea -trouble  sleeping This list may not describe all possible side effects. Call your doctor for medical advice about side effects. You may report side effects to FDA at 1-800-FDA-1088. Where should I keep my medicine? Keep out of the reach of children. Store at room temperature of 15 to 30 degrees C (59 to 86 degrees F). Throw away any unused medicine after the expiration date. NOTE: This sheet is a summary. It may not cover all possible information. If you have questions about this medicine, talk to your doctor, pharmacist, or health care provider.  2013, [employer]. (05/26/2011 12:41:14 PM)  Denosumab injection What is this medicine? DENOSUMAB slows bone breakdown. It is used to treat osteoporosis in women after menopause and in men. This medicine is also used to prevent bone fractures and other bone problems caused by cancer bone metastases. This medicine may be used for other purposes; ask your health care provider or pharmacist if you have questions. What should I tell my health care provider before I take this medicine? They need to know if you have any of these conditions: -dental disease -eczema -infection or history of infections -kidney disease or on dialysis -low blood calcium or vitamin D -malabsorption syndrome -scheduled to have surgery or tooth extraction -taking medicine that contains denosumab -thyroid or parathyroid disease -an unusual reaction to denosumab, other medicines, foods, dyes, or preservatives -pregnant or trying to get pregnant -breast-feeding How should I use this  medicine? This medicine is for injection under the skin. It is given by a health care professional in a hospital or clinic setting. If you are getting Prolia, a special MedGuide will be given to you by the pharmacist with each prescription and refill. Be sure to read this information carefully each time. Talk to your pediatrician regarding the use of this medicine in children. Special care may be  needed. Overdosage: If you think you've taken too much of this medicine contact a poison control center or emergency room at once. Overdosage: If you think you have taken too much of this medicine contact a poison control center or emergency room at once. NOTE: This medicine is only for you. Do not share this medicine with others. What if I miss a dose? It is important not to miss your dose. Call your doctor or health care professional if you are unable to keep an appointment. What may interact with this medicine? Do not take this medicine with any of the following medications: -other medicines containing denosumab This medicine may also interact with the following medications: -medicines that suppress the immune system -medicines that treat cancer -steroid medicines like prednisone or cortisone This list may not describe all possible interactions. Give your health care provider a list of all the medicines, herbs, non-prescription drugs, or dietary supplements you use. Also tell them if you smoke, drink alcohol, or use illegal drugs. Some items may interact with your medicine. What should I watch for while using this medicine? Visit your doctor or health care professional for regular checks on your progress. Your doctor or health care professional may order blood tests and other tests to see how you are doing. Call your doctor or health care professional if you get a cold or other infection while receiving this medicine. Do not treat yourself. This medicine may decrease your body's ability to fight infection. You should make sure you get enough calcium and vitamin D while you are taking this medicine, unless your doctor tells you not to. Discuss the foods you eat and the vitamins you take with your health care professional. See your dentist regularly. Brush and floss your teeth as directed. Before you have any dental work done, tell your dentist you are receiving this medicine. What side effects  may I notice from receiving this medicine? Side effects that you should report to your doctor or health care professional as soon as possible: -allergic reactions like skin rash, itching or hives, swelling of the face, lips, or tongue -breathing problems -chest pain -fast, irregular heartbeat -feeling faint or lightheaded, falls -fever, chills, or any other sign of infection -muscle spasms, tightening, or twitches -numbness or tingling -skin blisters or bumps, or is dry, peels, or red -slow healing or unexplained pain in the mouth or jaw -unusual bleeding or bruising Side effects that usually do not require medical attention (Report these to your doctor or health care professional if they continue or are bothersome.): -muscle pain -stomach upset, gas This list may not describe all possible side effects. Call your doctor for medical advice about side effects. You may report side effects to FDA at 1-800-FDA-1088. Where should I keep my medicine? This medicine is only given in a clinic, doctor's office, or other health care setting and will not be stored at home. NOTE: This sheet is a summary. It may not cover all possible information. If you have questions about this medicine, talk to your doctor, pharmacist, or health care provider.  2013, [employer]. (  02/14/2011 3:40:41 PM)

## 2013-02-13 ENCOUNTER — Telehealth: Payer: Self-pay | Admitting: Medical Oncology

## 2013-02-13 NOTE — Telephone Encounter (Signed)
Patient LVMOM requesting results of chest xray completed 02/11/13  Mssg forwarded to MD/NP  Next sched appt. 05/09/13 lab/NP/inj

## 2013-02-14 ENCOUNTER — Telehealth: Payer: Self-pay | Admitting: Medical Oncology

## 2013-02-14 NOTE — Telephone Encounter (Signed)
Per NP, informed patient that her check xray shows no definitve signs of pneumonia and for patient to continue taking the levaquin and albuterol as prescribed. Patient states "today I am feeling much much better, yesterday talking made my chest hurt, but today feeling much better." Encouraged patient to call office should she feel worse or with any questions or concerns. Patient gave verbal understanding, no further questions at this time.

## 2013-03-03 ENCOUNTER — Encounter: Payer: Self-pay | Admitting: *Deleted

## 2013-03-03 NOTE — Progress Notes (Signed)
Mailed after appt letter to pt. 

## 2013-05-08 ENCOUNTER — Other Ambulatory Visit: Payer: Self-pay | Admitting: *Deleted

## 2013-05-08 DIAGNOSIS — C50119 Malignant neoplasm of central portion of unspecified female breast: Secondary | ICD-10-CM

## 2013-05-09 ENCOUNTER — Encounter: Payer: Self-pay | Admitting: Adult Health

## 2013-05-09 ENCOUNTER — Other Ambulatory Visit (HOSPITAL_BASED_OUTPATIENT_CLINIC_OR_DEPARTMENT_OTHER): Payer: PRIVATE HEALTH INSURANCE

## 2013-05-09 ENCOUNTER — Ambulatory Visit (HOSPITAL_BASED_OUTPATIENT_CLINIC_OR_DEPARTMENT_OTHER): Payer: PRIVATE HEALTH INSURANCE | Admitting: Adult Health

## 2013-05-09 ENCOUNTER — Telehealth: Payer: Self-pay | Admitting: Adult Health

## 2013-05-09 ENCOUNTER — Encounter (INDEPENDENT_AMBULATORY_CARE_PROVIDER_SITE_OTHER): Payer: Self-pay

## 2013-05-09 ENCOUNTER — Ambulatory Visit: Payer: Medicare Other

## 2013-05-09 VITALS — BP 168/82 | HR 84 | Temp 98.5°F | Resp 20 | Ht 60.0 in | Wt 164.1 lb

## 2013-05-09 DIAGNOSIS — C7951 Secondary malignant neoplasm of bone: Secondary | ICD-10-CM

## 2013-05-09 DIAGNOSIS — C7800 Secondary malignant neoplasm of unspecified lung: Secondary | ICD-10-CM

## 2013-05-09 DIAGNOSIS — C50119 Malignant neoplasm of central portion of unspecified female breast: Secondary | ICD-10-CM

## 2013-05-09 DIAGNOSIS — C50112 Malignant neoplasm of central portion of left female breast: Secondary | ICD-10-CM

## 2013-05-09 LAB — COMPREHENSIVE METABOLIC PANEL (CC13)
ALT: 11 U/L (ref 0–55)
Alkaline Phosphatase: 62 U/L (ref 40–150)
Anion Gap: 8 mEq/L (ref 3–11)
Creatinine: 1.5 mg/dL — ABNORMAL HIGH (ref 0.6–1.1)
Sodium: 141 mEq/L (ref 136–145)
Total Bilirubin: 0.72 mg/dL (ref 0.20–1.20)
Total Protein: 7.2 g/dL (ref 6.4–8.3)

## 2013-05-09 LAB — CBC WITH DIFFERENTIAL/PLATELET
EOS%: 2.1 % (ref 0.0–7.0)
LYMPH%: 22.1 % (ref 14.0–49.7)
MCH: 21.3 pg — ABNORMAL LOW (ref 25.1–34.0)
MCHC: 31.7 g/dL (ref 31.5–36.0)
MCV: 67.2 fL — ABNORMAL LOW (ref 79.5–101.0)
MONO%: 10.8 % (ref 0.0–14.0)
Platelets: 548 10*3/uL — ABNORMAL HIGH (ref 145–400)
RBC: 6.99 10*6/uL — ABNORMAL HIGH (ref 3.70–5.45)
RDW: 20.2 % — ABNORMAL HIGH (ref 11.2–14.5)
nRBC: 0 % (ref 0–0)

## 2013-05-09 NOTE — Telephone Encounter (Signed)
, °

## 2013-05-09 NOTE — Progress Notes (Signed)
[name] [id number] 1921/04/29 79 y.o. 05/11/2013 8:13 AM  CC  Monique Pitter, MD [address] [place] [address] Dr. Emelia [name]  DIAGNOSIS: 79 year old female with diagnosis of triple-negative left breast cancer.  PRIOR HISTORY:  [name] is a 79 y.o. female.  With a prior history of left lumpectomy and sentinel lymph node biopsy 18 years ago followed by radiation therapy. About 4 months ago she noted a left breast mass with increasing pain and some scabbing around the nipple. She had evaluation performed by Dr. Latricia [name] at [facility]. There was a mass correlating with the palpable area. She underwent a core biopsy that showed invasive ductal carcinoma with lymphovascular invasion. It was triple negative. She underwent MRI that showed radiation changes and a large mass where conglomeration or masses. She was seen by Dr. Emelia [name] who discussed different treatment options with the patient including mastectomy which would be the best option. However he was concerned about wound closure. He also referred the patient to medical oncology for further evaluation and management.  INTERVAL HISTORY: Is returning for observation for her metastatic triple negative breast cancer.  She is doing well today.  The mass in her left breast has remained stable.  She continues to opt for no treatment.  She informed me that she does not want any treatment with Xgeva.  She came with her granddaughters today.  She denies any night sweats, pain, fevers, chills, unintentional weight loss, or any further concerns.    Past Medical History: Past Medical History  Diagnosis Date  . Hypertension   . Diabetes mellitus without complication     Past Surgical History: Past Surgical History  Procedure Laterality Date  . Cholecystectomy  2011  . Shoulder open rotator cuff repair    . Breast lumpectomy Left     with alnd, followed by xrt    Family History: Family  History  Problem Relation Age of Onset  . Cancer Mother     breast ca  . Stroke Father   . Heart disease Father     Social History History  Substance Use Topics  . Smoking status: Never Smoker   . Smokeless tobacco: Not on file  . Alcohol Use: No    Allergies: No Known Allergies  Current Medications: Current Outpatient Prescriptions  Medication Sig Dispense Refill  . amLODipine (NORVASC) 5 MG tablet       . aspirin EC 81 MG tablet Take 81 mg by mouth daily.      Marland Kitchen glucose blood test strip 1 each by Other route as needed for other. Use as instructed      . saxagliptin HCl (ONGLYZA) 5 MG TABS tablet Take by mouth daily.      . Aclidinium Bromide (TUDORZA PRESSAIR) 400 MCG/ACT AEPB Inhale 1 Inhaler into the lungs 2 (two) times daily.      Marland Kitchen albuterol (PROVENTIL HFA;VENTOLIN HFA) 108 (90 BASE) MCG/ACT inhaler Inhale 2 puffs into the lungs every 6 (six) hours as needed for wheezing.  1 Inhaler  2   No current facility-administered medications for this visit.    REVIEW OF SYSTEMS:  A 10 point review of systems was conducted and is otherwise negative except for what is noted above.     PHYSICAL EXAMINATION: Blood pressure 168/82, pulse 84, temperature 98.5 F (36.9 C), temperature source Oral, resp. rate 20, height 5' (1.524 m), weight 164 lb 1.6 oz (74.435 kg). General: Patient is a well appearing female in  no acute distress HEENT: PERRLA, sclerae anicteric no conjunctival pallor, MMM Neck: supple, no palpable adenopathy Lungs: rhonchi in bilateral lobes throughout, wheezing in bilateral upper lobes.   Cardiovascular: regular rate rhythm, S1, S2, no murmurs, rubs or gallops Abdomen: Soft, non-tender, non-distended, normoactive bowel sounds, no HSM Extremities: warm and well perfused, no clubbing, cyanosis, or edema Skin: No rashes or lesions Neuro: Non-focal ECOG: 2  STUDIES/RESULTS: Mr Breast Bilateral W Wo Contrast  01/16/2013   **ADDENDUM** CREATED: 01/16/2013  17:51:11  Addition to impression 1:  The left breast skin thickening does not definitely demonstrate enhancement and is likely sequelae of prior radiation therapy.  An inflammatory component is not entirely excluded.  **END ADDENDUM** SIGNED BY: Antonieta Loveless, M.D  01/16/2013   *RADIOLOGY REPORT*  Clinical Data:Patient with newly diagnosed to left breast malignancy.  BUN and creatinine were obtained on site at [facility] at [address] Results:  BUN 17 mg/dL,  Creatinine 1.5 mg/dL.  GFR: 40  BILATERAL BREAST MRI WITH AND WITHOUT CONTRAST  Technique: Multiplanar, multisequence MR images of both breasts were obtained prior to and following the intravenous administration of 8ml of Multihance.  THREE-DIMENSIONAL MR IMAGE RENDERING ON INDEPENDENT WORKSTATION: Three-dimensional MR images were rendered by post-processing  of the original MR data on an independent DynaCad workstation. The three-dimensional MR images were interpreted, and findings are reported in the following complete MRI report for this study.  Comparison:  Recent imaging examinations.  FINDINGS:  Breast composition:  b.  Scattered fibroglandular tissue  Background parenchymal enhancement: Mild  Right breast:  No mass or abnormal enhancement.  Left breast:  The left breast is diminished in size with diffuse T2 signal abnormality throughout the breast parenchyma.  There is marked left breast skin thickening. Centrally within the left breast there are multiple large adjacent centrally necrotic enhancing masses with the total conglomerate measuring 8 x 5 x 6 cm. The more central necrotic mass measures 5 x 4 x 4.5 cm with the more anterior aspect of this large conglomerate in the immediate retroareolar location measuring 1.9 x 1.7 x 1.8 cm.  There is associated left nipple retraction.  Lymph nodes:  No abnormal appearing lymph nodes.  Ancillary findings:  Multiple pulmonary nodules identified with the largest in the right lung measuring 1.1  cm.  IMPRESSION: 1.  Large conglomerate of adjacent centrally necrotic masses within the left breast extending to the immediate retroareolar location with left nipple retraction compatible with biopsy proven malignancy.  Extensive left breast edema and associated skin thickening is concerning for left breast inflammatory carcinoma.  2.  Incidentally identified pulmonary nodules, the largest of which measures 1.1 cm.  Further evaluation with chest CT is recommended.  RECOMMENDATION:  1. Treatment plan for left breast malignancy.  2. Further imaging evaluation of pulmonary nodules which may represent metastatic disease.  BI-RADS CATEGORY 6:  Known biopsy-proven malignancy - appropriate action should be taken.   Original Report Authenticated By: Annia Belt, M.D   Nm Pet Image Initial (pi) Skull Base To Thigh  02/04/2013   CLINICAL DATA:  Initial treatment strategy for breast carcinoma. Marland Kitchen  EXAM: NUCLEAR MEDICINE PET SKULL BASE TO THIGH  FASTING BLOOD GLUCOSE:  Value:  85 mg/dl  TECHNIQUE: The mCi O-13 FDG was injected intravenously. CT data was obtained and used for attenuation correction and anatomic localization only. (This was not acquired as a diagnostic CT examination.) Additional exam technical data entered on technologist worksheet.  COMPARISON:  None  FINDINGS: NECK  No hypermetabolic lymph nodes in the neck.  CHEST  Nodule within the anterior superior right upper lobe measures 8 mm (image 51) with SUV max = 7.6. Second hypermetabolic nodule in the right upper lobe measures 9 mm (image 64) with SUV max = 5.9. No hypermetabolic mediastinal lymph nodes.  There is a rim of hypermetabolic activity at the surgical site in the left breast. There is hypermetabolic activity within the skin surface of the left breast. No hypermetabolic left axillary lymph nodes. No hypermetabolic supraclavicular or infraclavicular nodes.  ABDOMEN/PELVIS  No abnormal hypermetabolic activity within the liver, pancreas, adrenal glands,  or spleen. No hypermetabolic lymph nodes in the abdomen or pelvis.  SKELETON  There is severe endplate sclerosis at T11-T12 and T8 -T9 with minimal hypermetabolic activity is level suggests degenerative change. There is moderate uptake within the left and right posterior iliac bones (image 143) with moderate FDG activity SUV max = 6.1 . There is a focus of intense metabolic activity within the glenoid surface of the right scapula (image 25).  IMPRESSION: 1. Two hypermetabolic nodules in the right upper lobe are consistent with pulmonary metastasis.  2. Hypermetabolic lesion in the left breast with hypermetabolic skin thickening likely combination of tumor and postsurgical inflammation.  3. Concern for skeletal metastasis. There is a focal lesion in the right scapula and more diffuse uptake noted in the posterior left and right iliac bones.  4. Severe endplate changes in the lower thoracic spine there are likely degenerative. These are not significant changed from prior CT of 06/25/2010.   Electronically Signed   By: Genevive Bi M.D.   On: 02/04/2013 14:20     LABS:    Chemistry      Component Value Date/Time   NA 141 05/09/2013 1042   NA 139 07/12/2010 0338   K 4.8 05/09/2013 1042   K 3.5 07/12/2010 0338   CL 107 07/12/2010 0338   CO2 27 05/09/2013 1042   CO2 26 07/12/2010 0338   BUN 19.0 05/09/2013 1042   BUN 5* 07/12/2010 0338   CREATININE 1.5* 05/09/2013 1042   CREATININE 0.96 07/12/2010 0338      Component Value Date/Time   CALCIUM 10.1 05/09/2013 1042   CALCIUM 8.0* 07/12/2010 0338   ALKPHOS 62 05/09/2013 1042   ALKPHOS 90 07/10/2010 0540   AST 24 05/09/2013 1042   AST 60* 07/10/2010 0540   ALT 11 05/09/2013 1042   ALT 34 07/10/2010 0540   BILITOT 0.72 05/09/2013 1042   BILITOT 0.7 07/10/2010 0540      Lab Results  Component Value Date   WBC 4.4 05/09/2013   HGB 14.9 05/09/2013   HCT 47.0* 05/09/2013   MCV 67.2* 05/09/2013   PLT 548* 05/09/2013   PATHOLOGY: ADDITIONAL  INFORMATION: PROGNOSTIC INDICATORS - ACIS Results: IMMUNOHISTOCHEMICAL AND MORPHOMETRIC ANALYSIS BY THE AUTOMATED CELLULAR IMAGING SYSTEM (ACIS) Estrogen Receptor: 0%, NEGATIVE Progesterone Receptor: 0%, NEGATIVE Proliferation Marker Ki67: 83% COMMENT: The negative hormone receptor study(ies) in this case have no internal positive control. REFERENCE RANGE ESTROGEN RECEPTOR NEGATIVE <1% POSITIVE =>1% PROGESTERONE RECEPTOR NEGATIVE <1% POSITIVE =>1% All controls stained appropriately Abigail Miyamoto MD Pathologist, Electronic Signature ( Signed 01/21/2013) CHROMOGENIC IN-SITU HYBRIDIZATION Results: HER-2/NEU BY CISH - NO AMPLIFICATION OF HER-2 DETECTED. RESULT RATIO OF HER2: CEP 17 SIGNALS 1.32 1 of 3 FINAL for [place], Leana A ([id number]) ADDITIONAL INFORMATION:(continued) AVERAGE HER2 COPY NUMBER PER CELL 2.25 REFERENCE RANGE NEGATIVE HER2/Chr17 Ratio <2.0 and Average HER2 copy number <4.0 EQUIVOCAL HER2/Chr17 Ratio <2.0  and Average HER2 copy number 4.0 and <6.0 POSITIVE HER2/Chr17 Ratio >=2.0 and/or Average HER2 copy number >=6.0 Abigail Miyamoto MD Pathologist, Electronic Signature ( Signed 01/17/2013) FINAL DIAGNOSIS Diagnosis Breast, left, needle core biopsy, mass - INVASIVE DUCTAL CARCINOMA, SEE COMMENT. - DUCTAL CARCINOMA IN SITU. - LYMPHOVASCULAR INVASION IDENTIFIED. Microscopic Comment There is extensive tumor necrosis present. Although the grade of tumor is best assessed at resection, with these biopsies, both in situ and invasive carcinoma are grade II-III. Breast prognostic studies are pending and will be reported in an addendum. The case was reviewed with Dr Colonel Bald, who concurs. (CR:kh 01-15-13) [place] RUND DO Pathologist, Electronic Signature (Case signed 01/15/2013)  ASSESSMENT    79 year old female with  #1  Recurrent invasive ductal carcinoma that is triple negative in the left breast. She had prior lumpectomy and sentinel lymph node biopsy and  radiation in the past and is ipsilateral breast. Patient has a high-risk tumor. After discussions of surgery and chemotherapy, and the fact that she has metastatic disease on PET in the bone and lung, she has decided against treatment, including Xgeva.    PLAN:  1.   Ms. Tribbey is doing well today.  She denies any pain, her labs are stable.  We discussed her disease.  Her breast mass remains stable.  Although she is feeling well currently.  I discussed that in the future the patient will need palliative care and hospice.  I encouraged the patient and her granddaughters to contact our office should she have any increased pain, shortness of breath or any other difficulty.   2.  The patient will return in 3 months for labs and follow up.     All questions were answered. The patient knows to call the clinic with any problems, questions or concerns. We can certainly see the patient much sooner if necessary.  I spent 25 minutes counseling the patient face to face. The total time spent in the appointment was 30 minutes.  Illa Level, NP Medical Oncology [facility] [phone] 05/11/2013, 8:13 AM

## 2013-07-23 ENCOUNTER — Observation Stay (HOSPITAL_COMMUNITY): Payer: PRIVATE HEALTH INSURANCE

## 2013-07-23 ENCOUNTER — Encounter (HOSPITAL_COMMUNITY): Payer: Self-pay | Admitting: Emergency Medicine

## 2013-07-23 ENCOUNTER — Telehealth: Payer: Self-pay | Admitting: *Deleted

## 2013-07-23 ENCOUNTER — Emergency Department (HOSPITAL_COMMUNITY): Payer: PRIVATE HEALTH INSURANCE

## 2013-07-23 ENCOUNTER — Inpatient Hospital Stay (HOSPITAL_COMMUNITY)
Admission: EM | Admit: 2013-07-23 | Discharge: 2013-07-24 | DRG: 641 | Disposition: A | Discharge status: Home Health | Payer: PRIVATE HEALTH INSURANCE | Attending: Family Medicine | Admitting: Family Medicine

## 2013-07-23 DIAGNOSIS — C7949 Secondary malignant neoplasm of other parts of nervous system: Secondary | ICD-10-CM

## 2013-07-23 DIAGNOSIS — Z66 Do not resuscitate: Secondary | ICD-10-CM | POA: Diagnosis present

## 2013-07-23 DIAGNOSIS — R7989 Other specified abnormal findings of blood chemistry: Secondary | ICD-10-CM | POA: Diagnosis present

## 2013-07-23 DIAGNOSIS — Z79899 Other long term (current) drug therapy: Secondary | ICD-10-CM

## 2013-07-23 DIAGNOSIS — C50119 Malignant neoplasm of central portion of unspecified female breast: Secondary | ICD-10-CM

## 2013-07-23 DIAGNOSIS — R748 Abnormal levels of other serum enzymes: Secondary | ICD-10-CM | POA: Diagnosis present

## 2013-07-23 DIAGNOSIS — E860 Dehydration: Principal | ICD-10-CM | POA: Diagnosis present

## 2013-07-23 DIAGNOSIS — Z803 Family history of malignant neoplasm of breast: Secondary | ICD-10-CM

## 2013-07-23 DIAGNOSIS — C7952 Secondary malignant neoplasm of bone marrow: Secondary | ICD-10-CM

## 2013-07-23 DIAGNOSIS — R410 Disorientation, unspecified: Secondary | ICD-10-CM

## 2013-07-23 DIAGNOSIS — D649 Anemia, unspecified: Secondary | ICD-10-CM | POA: Diagnosis present

## 2013-07-23 DIAGNOSIS — Z7982 Long term (current) use of aspirin: Secondary | ICD-10-CM

## 2013-07-23 DIAGNOSIS — C7931 Secondary malignant neoplasm of brain: Secondary | ICD-10-CM | POA: Diagnosis present

## 2013-07-23 DIAGNOSIS — I10 Essential (primary) hypertension: Secondary | ICD-10-CM | POA: Diagnosis present

## 2013-07-23 DIAGNOSIS — C799 Secondary malignant neoplasm of unspecified site: Secondary | ICD-10-CM

## 2013-07-23 DIAGNOSIS — C7951 Secondary malignant neoplasm of bone: Secondary | ICD-10-CM | POA: Diagnosis present

## 2013-07-23 DIAGNOSIS — C7800 Secondary malignant neoplasm of unspecified lung: Secondary | ICD-10-CM | POA: Diagnosis present

## 2013-07-23 DIAGNOSIS — R0789 Other chest pain: Secondary | ICD-10-CM | POA: Diagnosis present

## 2013-07-23 DIAGNOSIS — E119 Type 2 diabetes mellitus without complications: Secondary | ICD-10-CM | POA: Diagnosis present

## 2013-07-23 DIAGNOSIS — C50919 Malignant neoplasm of unspecified site of unspecified female breast: Secondary | ICD-10-CM | POA: Diagnosis present

## 2013-07-23 DIAGNOSIS — R778 Other specified abnormalities of plasma proteins: Secondary | ICD-10-CM | POA: Diagnosis present

## 2013-07-23 DIAGNOSIS — C801 Malignant (primary) neoplasm, unspecified: Secondary | ICD-10-CM

## 2013-07-23 DIAGNOSIS — R799 Abnormal finding of blood chemistry, unspecified: Secondary | ICD-10-CM

## 2013-07-23 HISTORY — DX: Gastrointestinal hemorrhage, unspecified: K92.2

## 2013-07-23 HISTORY — DX: Malignant neoplasm of unspecified site of unspecified female breast: C50.919

## 2013-07-23 HISTORY — DX: Anemia, unspecified: D64.9

## 2013-07-23 LAB — CBC WITH DIFFERENTIAL/PLATELET
Basophils Absolute: 0 10*3/uL (ref 0.0–0.1)
Basophils Relative: 0 % (ref 0–1)
EOS ABS: 0.1 10*3/uL (ref 0.0–0.7)
EOS PCT: 1 % (ref 0–5)
HCT: 46.7 % — ABNORMAL HIGH (ref 36.0–46.0)
Hemoglobin: 15.5 g/dL — ABNORMAL HIGH (ref 12.0–15.0)
Lymphocytes Relative: 15 % (ref 12–46)
Lymphs Abs: 1.1 10*3/uL (ref 0.7–4.0)
MCH: 21.3 pg — AB (ref 26.0–34.0)
MCHC: 33.2 g/dL (ref 30.0–36.0)
MCV: 64.1 fL — ABNORMAL LOW (ref 78.0–100.0)
MONO ABS: 1 10*3/uL (ref 0.1–1.0)
Monocytes Relative: 14 % — ABNORMAL HIGH (ref 3–12)
NEUTROS PCT: 70 % (ref 43–77)
Neutro Abs: 4.9 10*3/uL (ref 1.7–7.7)
PLATELETS: 529 10*3/uL — AB (ref 150–400)
RBC: 7.28 MIL/uL — AB (ref 3.87–5.11)
RDW: 17.3 % — ABNORMAL HIGH (ref 11.5–15.5)
WBC: 7.1 10*3/uL (ref 4.0–10.5)

## 2013-07-23 LAB — GLUCOSE, CAPILLARY: GLUCOSE-CAPILLARY: 91 mg/dL (ref 70–99)

## 2013-07-23 LAB — URINALYSIS, ROUTINE W REFLEX MICROSCOPIC
BILIRUBIN URINE: NEGATIVE
GLUCOSE, UA: NEGATIVE mg/dL
HGB URINE DIPSTICK: NEGATIVE
Ketones, ur: NEGATIVE mg/dL
Leukocytes, UA: NEGATIVE
Nitrite: NEGATIVE
PH: 5.5 (ref 5.0–8.0)
Protein, ur: NEGATIVE mg/dL
SPECIFIC GRAVITY, URINE: 1.014 (ref 1.005–1.030)
Urobilinogen, UA: 1 mg/dL (ref 0.0–1.0)

## 2013-07-23 LAB — COMPREHENSIVE METABOLIC PANEL
ALT: 6 U/L (ref 0–35)
AST: 30 U/L (ref 0–37)
Albumin: 3.7 g/dL (ref 3.5–5.2)
Alkaline Phosphatase: 71 U/L (ref 39–117)
BUN: 20 mg/dL (ref 6–23)
CALCIUM: 10.3 mg/dL (ref 8.4–10.5)
CO2: 22 meq/L (ref 19–32)
Chloride: 100 mEq/L (ref 96–112)
Creatinine, Ser: 1.31 mg/dL — ABNORMAL HIGH (ref 0.50–1.10)
GFR calc Af Amer: 40 mL/min — ABNORMAL LOW (ref >90)
GFR calc non Af Amer: 35 mL/min — ABNORMAL LOW (ref >90)
Glucose, Bld: 100 mg/dL — ABNORMAL HIGH (ref 70–99)
Potassium: 5 mEq/L (ref 3.7–5.3)
SODIUM: 138 meq/L (ref 137–147)
TOTAL PROTEIN: 7.3 g/dL (ref 6.0–8.3)
Total Bilirubin: 0.7 mg/dL (ref 0.3–1.2)

## 2013-07-23 LAB — PHOSPHORUS: Phosphorus: 2.8 mg/dL (ref 2.3–4.6)

## 2013-07-23 LAB — MAGNESIUM: Magnesium: 1.5 mg/dL (ref 1.5–2.5)

## 2013-07-23 LAB — TROPONIN I
TROPONIN I: 0.3 ng/mL — AB (ref <0.30)
Troponin I: 0.39 ng/mL (ref <0.30)

## 2013-07-23 MED ORDER — TETRACAINE HCL 0.5 % OP SOLN
2.0000 [drp] | Freq: Once | OPHTHALMIC | Status: AC
  Administered 2013-07-23: 2 [drp] via OPHTHALMIC
  Filled 2013-07-23: qty 2

## 2013-07-23 MED ORDER — SODIUM CHLORIDE 0.9 % IV SOLN
INTRAVENOUS | Status: DC
  Administered 2013-07-23: 22:00:00 via INTRAVENOUS

## 2013-07-23 MED ORDER — SODIUM CHLORIDE 0.9 % IJ SOLN: 3.0000 mL | Freq: Two times a day (BID) | INTRAMUSCULAR | Status: DC

## 2013-07-23 MED ORDER — AMLODIPINE BESYLATE 5 MG PO TABS
5.0000 mg | ORAL_TABLET | Freq: Every day | ORAL | Status: DC
  Administered 2013-07-24: 5 mg via ORAL
  Filled 2013-07-23: qty 1

## 2013-07-23 MED ORDER — ACETAMINOPHEN 325 MG PO TABS
650.0000 mg | ORAL_TABLET | Freq: Once | ORAL | Status: AC
  Administered 2013-07-23: 650 mg via ORAL
  Filled 2013-07-23: qty 2

## 2013-07-23 MED ORDER — ACETAMINOPHEN 650 MG RE SUPP: 650.0000 mg | Freq: Four times a day (QID) | RECTAL | Status: DC | PRN

## 2013-07-23 MED ORDER — SODIUM CHLORIDE 0.9 % IV SOLN
Freq: Once | INTRAVENOUS | Status: AC
  Administered 2013-07-23: 17:00:00 via INTRAVENOUS

## 2013-07-23 MED ORDER — HEPARIN SODIUM (PORCINE) 5000 UNIT/ML IJ SOLN
5000.0000 [IU] | Freq: Three times a day (TID) | INTRAMUSCULAR | Status: DC
  Administered 2013-07-23 – 2013-07-24 (×2): 5000 [IU] via SUBCUTANEOUS
  Filled 2013-07-23 (×6): qty 1

## 2013-07-23 MED ORDER — GADOBENATE DIMEGLUMINE 529 MG/ML IV SOLN
15.0000 mL | Freq: Once | INTRAVENOUS | Status: AC | PRN
  Administered 2013-07-23: 15 mL via INTRAVENOUS

## 2013-07-23 MED ORDER — ACETAMINOPHEN 325 MG PO TABS
650.0000 mg | ORAL_TABLET | Freq: Four times a day (QID) | ORAL | Status: DC | PRN
  Administered 2013-07-24 (×2): 650 mg via ORAL
  Filled 2013-07-23 (×2): qty 2

## 2013-07-23 MED ORDER — ONDANSETRON HCL 4 MG/2ML IJ SOLN: 4.0000 mg | Freq: Four times a day (QID) | INTRAMUSCULAR | Status: DC | PRN

## 2013-07-23 MED ORDER — FLUORESCEIN SODIUM 1 MG OP STRP
1.0000 | ORAL_STRIP | Freq: Once | OPHTHALMIC | Status: DC
  Filled 2013-07-23: qty 1

## 2013-07-23 MED ORDER — FLUORESCEIN SODIUM 1 MG OP STRP
1.0000 | ORAL_STRIP | Freq: Once | OPHTHALMIC | Status: AC
  Administered 2013-07-23: 1 via OPHTHALMIC

## 2013-07-23 MED ORDER — ONDANSETRON HCL 4 MG PO TABS: 4.0000 mg | ORAL_TABLET | Freq: Four times a day (QID) | ORAL | Status: DC | PRN

## 2013-07-23 MED ORDER — ASPIRIN EC 81 MG PO TBEC
81.0000 mg | DELAYED_RELEASE_TABLET | Freq: Every day | ORAL | Status: DC
  Administered 2013-07-24: 81 mg via ORAL
  Filled 2013-07-23: qty 1

## 2013-07-23 NOTE — ED Notes (Signed)
Per pt and family,  Pt feels sick.  Pt has a hx of breast cancer.  Pt has had increase in generalized pain, weakness and confusion.  Has hx of same but today is increasing.  Pt is oozing from breast cancer site.
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
## 2013-07-23 NOTE — Telephone Encounter (Signed)
Reports patient is in bed today and can't get up. She was ambulatory yesterday. She keeps saying she is in pain "all over". Grand daughter reports "she's taking out of her head and crying". Does not think she has any weakness on one side more than the other and is able to speak. No fever. Reports this started yesterday and is worse today. This RN instructed her to call 911 and get her to the emergency room now. [name] understands and agrees. Will make MD aware of call.

## 2013-07-23 NOTE — ED Notes (Signed)
Pt in MRI. Will transport pt to floor when she comes back

## 2013-07-23 NOTE — Consult Note (Signed)
Reason for Consult: Abnormal cardiac enzymes  Requesting Physician: Wyline Copas  Cardiologist: None  HPI: This is a 80 y.o. female with a past medical history significant for widely metastatic breast cancer involving the bones and lung presents with severe generalized pain including sharp chest pain. She has a left breast wound that requires dressing changes. She has not had fever or chills and denies dyspnea, syncope, palpitations or other cardiovascular symptoms. She is found to be dehydrated.  She does not wish aggressive chemotherapy/radiation therapy. DNR status. She has a history of relatively recent gastrointestinal bleeding. Coronary risk factors include hypertension diabetes mellitus, both of which have been well controlled. There is no known history of structural heart disease or coronary disease.  Her chest pain has been sharp, lancinating in brief and does not have the typical features of angina pectoris. Her electrocardiogram is normal. Cardiac troponin I was borderline elevated at 0.3.  PMHx:  Past Medical History  Diagnosis Date  . Hypertension   . Diabetes mellitus without complication   . Breast cancer     a. Metastatic triple negative breast CA. S/p L lumpectomy/sentinal node bx ~1996. Increasing mass 2014 with subsequent dx triple negative invasive ductal carcinoma with lymphovascular invasion. PET with mets in bone and lung. Pt opted for no tx.  . Lower GI bleed     a. 2007/2012 -  with associated anemia. Suspected diverticular bleed or from her arteriovenous malformations that were seen on prior colonscopy.  . Chronic anemia    Past Surgical History  Procedure Laterality Date  . Cholecystectomy  2011  . Shoulder open rotator cuff repair    . Breast lumpectomy Left     with alnd, followed by xrt    FAMHx: Family History  Problem Relation Age of Onset  . Cancer Mother     breast ca  . Stroke Father   . Heart disease Father     SOCHx:  reports that  she has never smoked. She does not have any smokeless tobacco history on file. She reports that she does not drink alcohol or use illicit drugs.  ALLERGIES: No Known Allergies  ROS: The patient specifically denies any chest pain onexertion, dyspnea at rest or with exertion, orthopnea, paroxysmal nocturnal dyspnea, syncope, palpitations, focal neurological deficits, intermittent claudication, lower extremity edema, unexplained weight gain, cough, hemoptysis or wheezing.  HOME MEDICATIONS: Prescriptions prior to admission  Medication Sig Dispense Refill  . amLODipine (NORVASC) 5 MG tablet Take 5 mg by mouth daily.       Marland Kitchen aspirin EC 81 MG tablet Take 81 mg by mouth daily.      Marland Kitchen glucose blood test strip 1 each by Other route as needed for other. Use as instructed      . naproxen sodium (ANAPROX) 220 MG tablet Take 220 mg by mouth once.      . saxagliptin HCl (ONGLYZA) 5 MG TABS tablet Take by mouth daily.        HOSPITAL MEDICATIONS: I have reviewed the patient's current medications. Prior to Admission:  Prescriptions prior to admission  Medication Sig Dispense Refill  . amLODipine (NORVASC) 5 MG tablet Take 5 mg by mouth daily.       Marland Kitchen aspirin EC 81 MG tablet Take 81 mg by mouth daily.      Marland Kitchen glucose blood test strip 1 each by Other route as needed for other. Use as instructed      . naproxen sodium (ANAPROX) 220 MG tablet  Take 220 mg by mouth once.      . saxagliptin HCl (ONGLYZA) 5 MG TABS tablet Take by mouth daily.        VITALS: Blood pressure 151/76, pulse 79, temperature 98.3 F (36.8 C), temperature source Oral, resp. rate 18, height 5\' 5"  (1.651 m), weight 73.7 kg (162 lb 7.7 oz), SpO2 98.00%.  PHYSICAL EXAM:  General: Alert, oriented x3, no distress Head: no evidence of trauma, PERRL, EOMI, no exophtalmos or lid lag, no myxedema, no xanthelasma; normal ears, nose and oropharynx Neck: normal jugular venous pulsations and no hepatojugular reflux; brisk carotid pulses  without delay and no carotid bruits Chest: clear to auscultation, no signs of consolidation by percussion or palpation, normal fremitus, symmetrical and full respiratory excursions Cardiovascular: normal position and quality of the apical impulse, regular rhythm, normal first heart sound and normal second heart sound, no rubs or gallops, no murmur Abdomen: no tenderness or distention, no masses by palpation, no abnormal pulsatility or arterial bruits, normal bowel sounds, no hepatosplenomegaly Extremities: no clubbing, cyanosis;  no edema; 2+ radial, ulnar and brachial pulses bilaterally; 2+ right femoral, posterior tibial and dorsalis pedis pulses; 2+ left femoral, posterior tibial and dorsalis pedis pulses; no subclavian or femoral bruits Neurological: grossly nonfocal   LABS  CBC  Recent Labs  07/23/13 1530  WBC 7.1  NEUTROABS 4.9  HGB 15.5*  HCT 46.7*  MCV 64.1*  PLT 123XX123*   Basic Metabolic Panel  Recent Labs  07/23/13 1530  NA 138  K 5.0  CL 100  CO2 22  GLUCOSE 100*  BUN 20  CREATININE 1.31*  CALCIUM 10.3   Liver Function Tests  Recent Labs  07/23/13 1530  AST 30  ALT 6  ALKPHOS 71  BILITOT 0.7  PROT 7.3  ALBUMIN 3.7   No results found for this basename: LIPASE, AMYLASE,  in the last 72 hours Cardiac Enzymes  Recent Labs  07/23/13 1530 07/23/13 1900  TROPONINI 0.30* 0.39*    IMAGING: Dg Chest 2 View  07/23/2013   CLINICAL DATA:  History of breast cancer.  Altered mental status.  EXAM: CHEST  2 VIEW  COMPARISON:  PA and lateral chest 02/11/2013.  FINDINGS: Innumerable bilateral pulmonary nodules are identified consistent with metastatic disease. There is no pneumothorax or pleural effusion. Heart size is normal. Surgical clips left axilla are noted.  IMPRESSION: Innumerable bilateral pulmonary nodules consistent with metastatic disease.   Electronically Signed   By: Inge Rise M.D.   On: 07/23/2013 16:07   Ct Head Wo Contrast  07/23/2013    CLINICAL DATA:  Altered mental status and weakness  EXAM: CT HEAD WITHOUT CONTRAST  TECHNIQUE: Contiguous axial images were obtained from the base of the skull through the vertex without intravenous contrast.  COMPARISON:  None.  FINDINGS: There is mild-to-moderate diffuse cerebral and cerebellar atrophy with compensatory ventriculomegaly. There is decreased density in the deep white matter of the right frontal lobe that is asymmetric without on the left and may reflect edema or old ischemic change. There is no mass effect upon the adjacent falx cerebrum. There is mildly decreased density in the deep white matter of both cerebral hemispheres elsewhere consistent with chronic small vessel ischemia. There is no evidence of an acute intracranial hemorrhage.  At bone window settings the observed portions of the paranasal sinuses and mastoid air cells are clear. No lytic or blastic bony lesion is demonstrated and there is no evidence of an acute skull fracture.  IMPRESSION: 1.  There is no evidence of an acute intracranial hemorrhage. 2. There is hypodensity in the right frontal deep white matter asymmetric the with that on the left which may reflect edema or encephalomalacia. The possibility of metastatic disease is raised. A follow-up MRI is recommended. 3. There is moderate diffuse cerebral and cerebellar atrophy consistent with chronic small vessel ischemia.   Electronically Signed   By: David  Martinique   On: 07/23/2013 15:41   Mr Brain W Wo Contrast  07/23/2013   CLINICAL DATA:  Metastatic breast cancer.  Abnormal head CT.  EXAM: MRI HEAD WITHOUT AND WITH CONTRAST  TECHNIQUE: Multiplanar, multiecho pulse sequences of the brain and surrounding structures were obtained without and with intravenous contrast.  CONTRAST:  58mL MULTIHANCE GADOBENATE DIMEGLUMINE 529 MG/ML IV SOLN  COMPARISON:  CT head from the same day.  FINDINGS: A peripheral lesion in the anterior right frontal lobe measures 11 x 11 x 8 mm. A much smaller  lesion in the left caudate head measures 4 mm. No other focal enhancing lesions are present. There is moderate edema surrounding the anterior right frontal lobe lesion. There is no significant edematous change surrounding the caudate head lesion. Scattered periventricular lesions are worse left than right. The ventricles are of normal size. No significant extra-axial fluid collection is present.  Flow is present in the major intracranial arteries. The globes and orbits are intact. The paranasal sinuses and mastoid air cells are clear.  IMPRESSION: 1. 11 x 11 x 8 mm enhancing lesion in the anterior right frontal lobe is most concerning for a breast cancer metastasis. 2. 4 mm metastasis within the left caudate head. 3. Scattered periventricular white matter disease is worse left than right. This likely reflects the sequelae of chronic microvascular ischemia.   Electronically Signed   By: Lawrence Santiago M.D.   On: 07/23/2013 20:42    ECG: NSR, no ST-T  Changes   TELEMETRY: No arrhythmia   IMPRESSION: 1. Isolated elevated cardiac troponin without a clinical picture suggestive of acute coronary syndrome and without electrocardiographic changes 2. Widely metastatic breast cancer 3. History of gastrointestinal bleeding  RECOMMENDATION: 1. I do not think further cardiac evaluation or therapy will be appropriate. She is very elderly and lifespan is drastically limited by widely metastatic cancer. She chosen to forego aggressive therapy. The risks of anticoagulations/antiplatelet therapy or invasive procedures exceed the potential benefit. Her symptoms are not cardiac in etiology and she would not benefit from cardiac interventions from a quality of life standpoint. Recommend focus on pain control. I discussed the potential benefits of a palliative care consult and home hospice with the patient and her family. Family appears receptive, although not sure that the patient herself understands the role of home  hospice.  Time Spent Directly with Patient: 60 minutes  Sanda Klein, MD, [facility] [phone] office [phone] pager   07/23/2013, 9:53 PM

## 2013-07-23 NOTE — ED Notes (Signed)
Called to give report. Nurse will call back 

## 2013-07-23 NOTE — H&P (Signed)
[facility] History and Physical  [name] [id number] DOB: 1921-03-12 DOA: 07/23/2013  Referring physician: Dr. Regenia Skeeter PCP: Elyn Peers, MD  Specialist: Cardiologist consulted by ED Personnel, I have placed consult to oncologist.  Chief Complaint: "feeling sick and hurt all over"  HPI: [name] is a 80 y.o. female  With recurrent invasive ductal carcinoma that is triple negative in left breast. As well as metastatic disease on PET scan to bone and lung. Is presenting with the above complaints. After discussion with her oncologist she was instructed to come to the ED for further evaluation recommendations. The patient has had some chest discomfort but mainly she reports it was related to her left breast carcinoma. She denies any chest pressure or tightness. She denies any fevers chills or any sick contacts.  Given patient's dehydration and new finding on CT scan of possible mass we were consulted for further evaluation and recommendations. Patient was also found to have elevated serum troponin and as such ED personnel has discussed case with our urologist who will plan on evaluating patient while in house.   Review of Systems:  14 point review of systems reviewed and negative unless otherwise mentioned above  Past Medical History  Diagnosis Date  . Hypertension   . Diabetes mellitus without complication    Past Surgical History  Procedure Laterality Date  . Cholecystectomy  2011  . Shoulder open rotator cuff repair    . Breast lumpectomy Left     with alnd, followed by xrt   Social History:  reports that she has never smoked. She does not have any smokeless tobacco history on file. She reports that she does not drink alcohol or use illicit drugs.  No Known Allergies  Family History  Problem Relation Age of Onset  . Cancer Mother     breast ca  . Stroke Father   . Heart disease Father      Prior to Admission medications   Medication Sig  Start Date End Date Taking? Authorizing Provider  amLODipine (NORVASC) 5 MG tablet Take 5 mg by mouth daily.  01/16/13  Yes Historical Provider, MD  aspirin EC 81 MG tablet Take 81 mg by mouth daily.   Yes Historical Provider, MD  glucose blood test strip 1 each by Other route as needed for other. Use as instructed   Yes Historical Provider, MD  naproxen sodium (ANAPROX) 220 MG tablet Take 220 mg by mouth once.   Yes Historical Provider, MD  saxagliptin HCl (ONGLYZA) 5 MG TABS tablet Take by mouth daily.   Yes Historical Provider, MD   Physical Exam: Filed Vitals:   07/23/13 1324  BP: 151/62  Pulse: 95  Temp: 98.4 F (36.9 C)  Resp: 16    BP 151/62  Pulse 95  Temp(Src) 98.4 F (36.9 C) (Oral)  Resp 16  SpO2 96%  General:  Appears calm and comfortable Eyes: non icteric, EOMI ENT: grossly normal hearing, lips & tongue, dry mucous membranes Neck: no goiter, supple Cardiovascular: RRR, no m/r/g. Telemetry: SR, no arrhythmias  Respiratory: CTA bilaterally, no w/r/r. Normal respiratory effort. Abdomen: soft, ntnd Skin: no rash or induration seen on limited exam Musculoskeletal: grossly normal tone BUE/BLE Psychiatric: grossly normal mood and affect, speech fluent and appropriate Neurologic: grossly non-focal.          Labs on Admission:  Basic Metabolic Panel:  Recent Labs Lab 07/23/13 1530  NA 138  K 5.0  CL 100  CO2 22  GLUCOSE 100*  BUN 20  CREATININE 1.31*  CALCIUM 10.3   Liver Function Tests:  Recent Labs Lab 07/23/13 1530  AST 30  ALT 6  ALKPHOS 71  BILITOT 0.7  PROT 7.3  ALBUMIN 3.7   No results found for this basename: LIPASE, AMYLASE,  in the last 168 hours No results found for this basename: AMMONIA,  in the last 168 hours CBC:  Recent Labs Lab 07/23/13 1530  WBC 7.1  NEUTROABS 4.9  HGB 15.5*  HCT 46.7*  MCV 64.1*  PLT 529*   Cardiac Enzymes:  Recent Labs Lab 07/23/13 1530  TROPONINI 0.30*    BNP (last 3 results) No results  found for this basename: PROBNP,  in the last 8760 hours CBG: No results found for this basename: GLUCAP,  in the last 168 hours  Radiological Exams on Admission: Dg Chest 2 View  07/23/2013   CLINICAL DATA:  History of breast cancer.  Altered mental status.  EXAM: CHEST  2 VIEW  COMPARISON:  PA and lateral chest 02/11/2013.  FINDINGS: Innumerable bilateral pulmonary nodules are identified consistent with metastatic disease. There is no pneumothorax or pleural effusion. Heart size is normal. Surgical clips left axilla are noted.  IMPRESSION: Innumerable bilateral pulmonary nodules consistent with metastatic disease.   Electronically Signed   By: Inge Rise M.D.   On: 07/23/2013 16:07   Ct Head Wo Contrast  07/23/2013   CLINICAL DATA:  Altered mental status and weakness  EXAM: CT HEAD WITHOUT CONTRAST  TECHNIQUE: Contiguous axial images were obtained from the base of the skull through the vertex without intravenous contrast.  COMPARISON:  None.  FINDINGS: There is mild-to-moderate diffuse cerebral and cerebellar atrophy with compensatory ventriculomegaly. There is decreased density in the deep white matter of the right frontal lobe that is asymmetric without on the left and may reflect edema or old ischemic change. There is no mass effect upon the adjacent falx cerebrum. There is mildly decreased density in the deep white matter of both cerebral hemispheres elsewhere consistent with chronic small vessel ischemia. There is no evidence of an acute intracranial hemorrhage.  At bone window settings the observed portions of the paranasal sinuses and mastoid air cells are clear. No lytic or blastic bony lesion is demonstrated and there is no evidence of an acute skull fracture.  IMPRESSION: 1. There is no evidence of an acute intracranial hemorrhage. 2. There is hypodensity in the right frontal deep white matter asymmetric the with that on the left which may reflect edema or encephalomalacia. The possibility  of metastatic disease is raised. A follow-up MRI is recommended. 3. There is moderate diffuse cerebral and cerebellar atrophy consistent with chronic small vessel ischemia.   Electronically Signed   By: David  Martinique   On: 07/23/2013 15:41    EKG: Independently reviewed. Sinus rhythm with no ST elevations or depressions  Assessment/Plan Active Problems:   Dehydration - Will plan on gently hydrating overnight.  - Reassess BMP next am.    Metastatic cancer to brain - Will consult oncologist for further evaluation and recommendations given the patient's overall clinical picture.  - Will obtain MRI of brain with contrast  Metastatic breast cancer - Patient's oncologist is Dr. Humphrey Rolls, will consult oncology as mentioned above  Elevated serum troponin - Cardiology consulted by ED per my discussion with the ED personnel.  I will defer further evaluation recommendations to them - Admit to telemetry. Patient denies any chest pressure or tightness currently and discomfort mainly from known breast  cancer - Serial troponins every 6 hours x3 - The patient's EKG shows no ST elevations or depressions, patient at this point denying any chest discomfort, as such at this point will not place on heparin gtt and defer further evaluation and recommendations to cardiologist.   Code Status: DO NOT RESUSCITATE Family Communication: Discussed with patient and granddaughter at bedside Disposition Plan: Pending further recommendations from oncologist and cardiologist  Time spent: > 55 minutes  Velvet Bathe [facility] Pager [phone]

## 2013-07-23 NOTE — Telephone Encounter (Signed)
Ok thanks KK

## 2013-07-24 LAB — CBC
HEMATOCRIT: 45.7 % (ref 36.0–46.0)
Hemoglobin: 14.7 g/dL (ref 12.0–15.0)
MCH: 20.9 pg — AB (ref 26.0–34.0)
MCHC: 32.2 g/dL (ref 30.0–36.0)
MCV: 65 fL — AB (ref 78.0–100.0)
PLATELETS: 499 10*3/uL — AB (ref 150–400)
RBC: 7.03 MIL/uL — ABNORMAL HIGH (ref 3.87–5.11)
RDW: 17.2 % — AB (ref 11.5–15.5)
WBC: 5.9 10*3/uL (ref 4.0–10.5)

## 2013-07-24 LAB — BASIC METABOLIC PANEL
BUN: 16 mg/dL (ref 6–23)
CALCIUM: 9.2 mg/dL (ref 8.4–10.5)
CHLORIDE: 103 meq/L (ref 96–112)
CO2: 19 meq/L (ref 19–32)
Creatinine, Ser: 1.18 mg/dL — ABNORMAL HIGH (ref 0.50–1.10)
GFR calc Af Amer: 46 mL/min — ABNORMAL LOW (ref >90)
GFR calc non Af Amer: 39 mL/min — ABNORMAL LOW (ref >90)
GLUCOSE: 99 mg/dL (ref 70–99)
Potassium: 4.2 mEq/L (ref 3.7–5.3)
Sodium: 138 mEq/L (ref 137–147)

## 2013-07-24 LAB — TROPONIN I: Troponin I: 0.32 ng/mL (ref <0.30)

## 2013-07-24 NOTE — Progress Notes (Signed)
UR completed. Patient changed to inpatient- requiring IVF @ 75cc/hr and + Troponin

## 2013-07-24 NOTE — Progress Notes (Signed)
Spoke with pt concerning Home Health, pt request [facility] for dressing changes. No preference in [facility] agency. [facility] selected. Referral given to in house rep.

## 2013-07-24 NOTE — Evaluation (Signed)
Physical Therapy One Time Evaluation Patient Details Name: [name] MRN: [id number] DOB: [birth year] Today's Date: 07/24/2013 Time: 1348-1400 PT Time Calculation (min): 12 min  PT Assessment / Plan / Recommendation History of Present Illness  80 y.o. female with a past medical history significant for widely metastatic breast cancer involving the bones and lung presents with severe generalized pain including sharp chest pain. She has a left breast wound that requires dressing changes. She has not had fever or chills and denies dyspnea, syncope, palpitations or other cardiovascular symptoms. She is found to be dehydrated. She does not wish aggressive chemotherapy/radiation therapy. DNR status. She has a history of relatively recent gastrointestinal bleeding. Coronary risk factors include hypertension diabetes mellitus, both of which have been well controlled. There is no known history of structural heart disease or coronary disease.  Clinical Impression  Patient evaluated by Physical Therapy with no further acute PT needs identified. All education has been completed and the patient has no further questions. Pt reports aide comes in every day for 1-1.5 hrs however she is independent with mobility and still involved in community esp church activities. See below for any follow-up Physial Therapy or equipment needs. PT is signing off. Thank you for this referral.      PT Assessment  Patent does not need any further PT services    Follow Up Recommendations  No PT follow up    Does the patient have the potential to tolerate intense rehabilitation      Barriers to Discharge        Equipment Recommendations  None recommended by PT    Recommendations for Other Services     Frequency      Precautions / Restrictions Precautions Precautions: Fall   Pertinent Vitals/Pain No c/o pain, positioned to comfort in recliner end of session      Mobility  Bed Mobility Overal bed mobility:  Modified Independent Transfers Overall transfer level: Modified independent Ambulation/Gait Ambulation/Gait assistance: Supervision Ambulation Distance (Feet): 200 Feet Assistive device: None Gait Pattern/deviations: Step-through pattern Gait velocity interpretation: <1.8 ft/sec, indicative of risk for recurrent falls General Gait Details: slow pace, no unsteady gait or LOB    Exercises     PT Diagnosis:    PT Problem List:   PT Treatment Interventions:       PT Goals(Current goals can be found in the care plan section) Acute Rehab PT Goals PT Goal Formulation: No goals set, d/c therapy  Visit Information  Last PT Received On: 07/24/13 Assistance Needed: +1 History of Present Illness: 80 y.o. female with a past medical history significant for widely metastatic breast cancer involving the bones and lung presents with severe generalized pain including sharp chest pain. She has a left breast wound that requires dressing changes. She has not had fever or chills and denies dyspnea, syncope, palpitations or other cardiovascular symptoms. She is found to be dehydrated. She does not wish aggressive chemotherapy/radiation therapy. DNR status. She has a history of relatively recent gastrointestinal bleeding. Coronary risk factors include hypertension diabetes mellitus, both of which have been well controlled. There is no known history of structural heart disease or coronary disease.       Prior [place] expects to be discharged to:: Private residence Living Arrangements: Alone Available Help at Discharge: Personal care attendant;Other (Comment) (every day 1-1.5 hrs) Type of Home: House Home Equipment: None Prior Function Level of Independence: Independent Communication Communication: No difficulties    Cognition  Cognition Arousal/Alertness: Awake/alert  Behavior During Therapy: WFL for tasks assessed/performed Overall Cognitive Status: Within  Functional Limits for tasks assessed    Extremity/Trunk Assessment Lower Extremity Assessment Lower Extremity Assessment: Overall WFL for tasks assessed   Balance Balance Overall balance assessment:  (states no hx of falls)  End of Session PT - End of Session Activity Tolerance: Patient tolerated treatment well Patient left: in chair;with call bell/phone within reach;with family/visitor present Nurse Communication: Mobility status  GP     Thurmond Hildebran,KATHrine E 07/24/2013, 3:03 PM Carmelia Bake, PT, DPT 07/24/2013 Pager: [phone]

## 2013-07-24 NOTE — Discharge Summary (Signed)
Physician Discharge Summary  [name] [id number] DOB: May 16, 1921 DOA: 07/23/2013  PCP: Elyn Peers, MD  Admit date: 07/23/2013 Discharge date: 07/24/2013  Time spent: > 35 minutes  Recommendations for Outpatient Follow-up:  1. Please follow up with oncologist within the next week. 2. Also we recommend that patient have 24 hour observation at home.    Discharge Diagnoses:  Active Problems:   Dehydration   Metastatic cancer to brain   Elevated troponin   Discharge Condition: stable  Diet recommendation: Diabetic diet.  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 As well as history of metastatic disease on PET scan and to bone and lung. Mid with complaints of feeling sick and hurts all over as well as decreased oral intake. We were consulted to admit patient for dehydration and further workup given the abnormal CT scan of the head suspicious for metastases to brain.  Hospital Course:  Dehydration  - Resolved with IV fluid rehydration and improved oral intake. - Patient currently requesting to go home - Physical therapy evaluated and recommended no PT followup with no appointments on discharge  Metastatic cancer to brain  - MRI of brain obtained and results listed below - Consulted oncology per patient preferred to be discharged prior to the visit from oncologist. After discussion with family and patient they would prefer to have patient followup with oncologist as outpatient.  Metastatic breast cancer  - Patient's oncologist is Dr. Humphrey Rolls.  - Patient preferred to leave prior to evaluation by oncology and would like to followup with oncologist as outpatient.   Elevated serum troponin  - Cardiology consulted and currently recommends no aggressive therapy - Will continue aspirin on  discharge  Diabetes - Patient's blood sugars have been close to normal limits. At this point we'll recommend diabetic diet for control as such will discontinue home hypoglycemic agent. Patient is to continue checking her blood sugars at least twice daily once fasting and once postprandial.   Procedures:  As listed below  Consultations:  Oncology  Discharge Exam: Filed Vitals:   07/24/13 1555  BP:   Pulse: 97  Temp:   Resp:     General: Pt in NAD, Alert and awake Cardiovascular: RRR, no MRG Respiratory: CTA BL, no wheezes  Discharge Instructions  Discharge Orders   Future Appointments Provider Department Dept Phone   08/08/2013 10:45 AM [facility] [phone]   08/08/2013 11:15 AM Minette Headland, NP [facility] [phone]   Future Orders Complete By Expires   Diet - low sodium heart healthy  As directed    Discharge instructions  As directed    Comments:     Will need close follow up with oncologist after discharge. Also recommend 24 hour close observation at home   Increase activity slowly  As directed        Medication List    STOP taking these medications       naproxen sodium 220 MG tablet  Commonly known as:  ANAPROX     ONGLYZA 5 MG Tabs tablet  Generic drug:  saxagliptin HCl      TAKE these medications       amLODipine 5 MG tablet  Commonly known as:  NORVASC  Take 5 mg by mouth daily.  aspirin EC 81 MG tablet  Take 81 mg by mouth daily.     glucose blood test strip  1 each by Other route as needed for other. Use as instructed       No Known Allergies    The results of significant diagnostics from this hospitalization (including imaging, microbiology, ancillary and laboratory) are listed below for reference.    Significant Diagnostic Studies: Dg Chest 2 View  07/23/2013   CLINICAL DATA:  History of breast cancer.  Altered mental status.  EXAM: CHEST   2 VIEW  COMPARISON:  PA and lateral chest 02/11/2013.  FINDINGS: Innumerable bilateral pulmonary nodules are identified consistent with metastatic disease. There is no pneumothorax or pleural effusion. Heart size is normal. Surgical clips left axilla are noted.  IMPRESSION: Innumerable bilateral pulmonary nodules consistent with metastatic disease.   Electronically Signed   By: Inge Rise M.D.   On: 07/23/2013 16:07   Ct Head Wo Contrast  07/23/2013   CLINICAL DATA:  Altered mental status and weakness  EXAM: CT HEAD WITHOUT CONTRAST  TECHNIQUE: Contiguous axial images were obtained from the base of the skull through the vertex without intravenous contrast.  COMPARISON:  None.  FINDINGS: There is mild-to-moderate diffuse cerebral and cerebellar atrophy with compensatory ventriculomegaly. There is decreased density in the deep white matter of the right frontal lobe that is asymmetric without on the left and may reflect edema or old ischemic change. There is no mass effect upon the adjacent falx cerebrum. There is mildly decreased density in the deep white matter of both cerebral hemispheres elsewhere consistent with chronic small vessel ischemia. There is no evidence of an acute intracranial hemorrhage.  At bone window settings the observed portions of the paranasal sinuses and mastoid air cells are clear. No lytic or blastic bony lesion is demonstrated and there is no evidence of an acute skull fracture.  IMPRESSION: 1. There is no evidence of an acute intracranial hemorrhage. 2. There is hypodensity in the right frontal deep white matter asymmetric the with that on the left which may reflect edema or encephalomalacia. The possibility of metastatic disease is raised. A follow-up MRI is recommended. 3. There is moderate diffuse cerebral and cerebellar atrophy consistent with chronic small vessel ischemia.   Electronically Signed   By: David  Martinique   On: 07/23/2013 15:41   Mr Brain W Wo Contrast  07/23/2013    CLINICAL DATA:  Metastatic breast cancer.  Abnormal head CT.  EXAM: MRI HEAD WITHOUT AND WITH CONTRAST  TECHNIQUE: Multiplanar, multiecho pulse sequences of the brain and surrounding structures were obtained without and with intravenous contrast.  CONTRAST:  31mL MULTIHANCE GADOBENATE DIMEGLUMINE 529 MG/ML IV SOLN  COMPARISON:  CT head from the same day.  FINDINGS: A peripheral lesion in the anterior right frontal lobe measures 11 x 11 x 8 mm. A much smaller lesion in the left caudate head measures 4 mm. No other focal enhancing lesions are present. There is moderate edema surrounding the anterior right frontal lobe lesion. There is no significant edematous change surrounding the caudate head lesion. Scattered periventricular lesions are worse left than right. The ventricles are of normal size. No significant extra-axial fluid collection is present.  Flow is present in the major intracranial arteries. The globes and orbits are intact. The paranasal sinuses and mastoid air cells are clear.  IMPRESSION: 1. 11 x 11 x 8 mm enhancing lesion in the anterior right frontal lobe is most concerning for a breast cancer metastasis.  2. 4 mm metastasis within the left caudate head. 3. Scattered periventricular white matter disease is worse left than right. This likely reflects the sequelae of chronic microvascular ischemia.   Electronically Signed   By: Lawrence Santiago M.D.   On: 07/23/2013 20:42    Microbiology: No results found for this or any previous visit (from the past 240 hour(s)).   Labs: Basic Metabolic Panel:  Recent Labs Lab 07/23/13 1530 07/23/13 1900 07/24/13 0315  NA 138  --  138  K 5.0  --  4.2  CL 100  --  103  CO2 22  --  19  GLUCOSE 100*  --  99  BUN 20  --  16  CREATININE 1.31*  --  1.18*  CALCIUM 10.3  --  9.2  MG  --  1.5  --   PHOS  --  2.8  --    Liver Function Tests:  Recent Labs Lab 07/23/13 1530  AST 30  ALT 6  ALKPHOS 71  BILITOT 0.7  PROT 7.3  ALBUMIN 3.7   No  results found for this basename: LIPASE, AMYLASE,  in the last 168 hours No results found for this basename: AMMONIA,  in the last 168 hours CBC:  Recent Labs Lab 07/23/13 1530 07/24/13 0315  WBC 7.1 5.9  NEUTROABS 4.9  --   HGB 15.5* 14.7  HCT 46.7* 45.7  MCV 64.1* 65.0*  PLT 529* 499*   Cardiac Enzymes:  Recent Labs Lab 07/23/13 1530 07/23/13 1900 07/23/13 2330  TROPONINI 0.30* 0.39* 0.32*   BNP: BNP (last 3 results) No results found for this basename: PROBNP,  in the last 8760 hours CBG:  Recent Labs Lab 07/23/13 2142  GLUCAP 91       Signed:  Velvet Bathe  [facility] 07/24/2013, 4:02 PM

## 2013-07-25 ENCOUNTER — Encounter (HOSPITAL_COMMUNITY): Payer: Self-pay | Admitting: Emergency Medicine

## 2013-07-25 ENCOUNTER — Inpatient Hospital Stay (HOSPITAL_COMMUNITY)
Admission: EM | Admit: 2013-07-25 | Discharge: 2013-07-29 | DRG: 055 | Disposition: A | Discharge status: Hospice | Payer: PRIVATE HEALTH INSURANCE | Attending: Internal Medicine | Admitting: Internal Medicine

## 2013-07-25 DIAGNOSIS — R778 Other specified abnormalities of plasma proteins: Secondary | ICD-10-CM

## 2013-07-25 DIAGNOSIS — C7931 Secondary malignant neoplasm of brain: Secondary | ICD-10-CM

## 2013-07-25 DIAGNOSIS — G40401 Other generalized epilepsy and epileptic syndromes, not intractable, with status epilepticus: Secondary | ICD-10-CM

## 2013-07-25 DIAGNOSIS — Z853 Personal history of malignant neoplasm of breast: Secondary | ICD-10-CM

## 2013-07-25 DIAGNOSIS — G40901 Epilepsy, unspecified, not intractable, with status epilepticus: Secondary | ICD-10-CM

## 2013-07-25 DIAGNOSIS — E119 Type 2 diabetes mellitus without complications: Secondary | ICD-10-CM | POA: Diagnosis present

## 2013-07-25 DIAGNOSIS — E860 Dehydration: Secondary | ICD-10-CM

## 2013-07-25 DIAGNOSIS — C7951 Secondary malignant neoplasm of bone: Secondary | ICD-10-CM | POA: Diagnosis present

## 2013-07-25 DIAGNOSIS — C7949 Secondary malignant neoplasm of other parts of nervous system: Principal | ICD-10-CM

## 2013-07-25 DIAGNOSIS — M791 Myalgia, unspecified site: Secondary | ICD-10-CM

## 2013-07-25 DIAGNOSIS — I10 Essential (primary) hypertension: Secondary | ICD-10-CM | POA: Diagnosis present

## 2013-07-25 DIAGNOSIS — Z7982 Long term (current) use of aspirin: Secondary | ICD-10-CM

## 2013-07-25 DIAGNOSIS — R569 Unspecified convulsions: Secondary | ICD-10-CM | POA: Diagnosis present

## 2013-07-25 DIAGNOSIS — G40211 Localization-related (focal) (partial) symptomatic epilepsy and epileptic syndromes with complex partial seizures, intractable, with status epilepticus: Secondary | ICD-10-CM | POA: Diagnosis present

## 2013-07-25 DIAGNOSIS — C7800 Secondary malignant neoplasm of unspecified lung: Secondary | ICD-10-CM | POA: Diagnosis present

## 2013-07-25 DIAGNOSIS — G40109 Localization-related (focal) (partial) symptomatic epilepsy and epileptic syndromes with simple partial seizures, not intractable, without status epilepticus: Secondary | ICD-10-CM | POA: Diagnosis present

## 2013-07-25 DIAGNOSIS — Z66 Do not resuscitate: Secondary | ICD-10-CM | POA: Diagnosis present

## 2013-07-25 DIAGNOSIS — C50119 Malignant neoplasm of central portion of unspecified female breast: Secondary | ICD-10-CM

## 2013-07-25 DIAGNOSIS — C7952 Secondary malignant neoplasm of bone marrow: Secondary | ICD-10-CM

## 2013-07-25 DIAGNOSIS — R7989 Other specified abnormal findings of blood chemistry: Secondary | ICD-10-CM

## 2013-07-25 DIAGNOSIS — Z515 Encounter for palliative care: Secondary | ICD-10-CM

## 2013-07-25 LAB — CBC WITH DIFFERENTIAL/PLATELET
BASOS ABS: 0 10*3/uL (ref 0.0–0.1)
Basophils Relative: 0 % (ref 0–1)
Eosinophils Absolute: 0.1 10*3/uL (ref 0.0–0.7)
Eosinophils Relative: 1 % (ref 0–5)
HCT: 45.5 % (ref 36.0–46.0)
Hemoglobin: 14.8 g/dL (ref 12.0–15.0)
LYMPHS PCT: 7 % — AB (ref 12–46)
Lymphs Abs: 0.5 10*3/uL — ABNORMAL LOW (ref 0.7–4.0)
MCH: 21.3 pg — ABNORMAL LOW (ref 26.0–34.0)
MCHC: 32.5 g/dL (ref 30.0–36.0)
MCV: 65.6 fL — ABNORMAL LOW (ref 78.0–100.0)
MONO ABS: 0.3 10*3/uL (ref 0.1–1.0)
Monocytes Relative: 4 % (ref 3–12)
NEUTROS PCT: 88 % — AB (ref 43–77)
Neutro Abs: 5.7 10*3/uL (ref 1.7–7.7)
Platelets: 517 10*3/uL — ABNORMAL HIGH (ref 150–400)
RBC: 6.94 MIL/uL — ABNORMAL HIGH (ref 3.87–5.11)
RDW: 17.7 % — ABNORMAL HIGH (ref 11.5–15.5)
Smear Review: INCREASED
WBC: 6.6 10*3/uL (ref 4.0–10.5)

## 2013-07-25 LAB — I-STAT CHEM 8, ED
BUN: 17 mg/dL (ref 6–23)
Calcium, Ion: 1.13 mmol/L (ref 1.13–1.30)
Chloride: 104 mEq/L (ref 96–112)
Creatinine, Ser: 1.5 mg/dL — ABNORMAL HIGH (ref 0.50–1.10)
GLUCOSE: 190 mg/dL — AB (ref 70–99)
HEMATOCRIT: 50 % — AB (ref 36.0–46.0)
HEMOGLOBIN: 17 g/dL — AB (ref 12.0–15.0)
POTASSIUM: 4 meq/L (ref 3.7–5.3)
Sodium: 140 mEq/L (ref 137–147)
TCO2: 23 mmol/L (ref 0–100)

## 2013-07-25 LAB — CBG MONITORING, ED: GLUCOSE-CAPILLARY: 139 mg/dL — AB (ref 70–99)

## 2013-07-25 MED ORDER — LORAZEPAM 2 MG/ML IJ SOLN: 1.0000 mg | Freq: Once | INTRAMUSCULAR | Status: DC

## 2013-07-25 MED ORDER — SODIUM CHLORIDE 0.9 % IV SOLN
1000.0000 mg | Freq: Two times a day (BID) | INTRAVENOUS | Status: DC
  Administered 2013-07-26 – 2013-07-29 (×8): 1000 mg via INTRAVENOUS
  Filled 2013-07-25 (×9): qty 10

## 2013-07-25 MED ORDER — SODIUM CHLORIDE 0.9 % IV SOLN
1400.0000 mg | INTRAVENOUS | Status: AC
  Administered 2013-07-25: 1400 mg via INTRAVENOUS
  Filled 2013-07-25: qty 28

## 2013-07-25 MED ORDER — LORAZEPAM 2 MG/ML IJ SOLN
0.5000 mg | Freq: Once | INTRAMUSCULAR | Status: AC
  Administered 2013-07-25: 0.5 mg via INTRAVENOUS
  Filled 2013-07-25: qty 1

## 2013-07-25 MED ORDER — LORAZEPAM 2 MG/ML IJ SOLN
1.0000 mg | Freq: Once | INTRAMUSCULAR | Status: AC
Start: 2013-07-25 — End: 2013-07-25
  Administered 2013-07-25: 1 mg via INTRAVENOUS
  Filled 2013-07-25: qty 1

## 2013-07-25 MED ORDER — PHENYTOIN SODIUM 50 MG/ML IJ SOLN
100.0000 mg | Freq: Three times a day (TID) | INTRAMUSCULAR | Status: DC
  Administered 2013-07-26 – 2013-07-27 (×4): 100 mg via INTRAVENOUS
  Filled 2013-07-25 (×7): qty 2

## 2013-07-25 MED ORDER — SODIUM CHLORIDE 0.9 % IV SOLN
INTRAVENOUS | Status: DC
  Administered 2013-07-25: 1000 mL via INTRAVENOUS
  Administered 2013-07-26 – 2013-07-28 (×2): via INTRAVENOUS

## 2013-07-25 MED ORDER — DEXAMETHASONE SODIUM PHOSPHATE 10 MG/ML IJ SOLN
10.0000 mg | Freq: Once | INTRAMUSCULAR | Status: AC
  Administered 2013-07-25: 10 mg via INTRAVENOUS
  Filled 2013-07-25: qty 1

## 2013-07-25 MED ORDER — SODIUM CHLORIDE 0.9 % IV SOLN
1000.0000 mg | Freq: Once | INTRAVENOUS | Status: AC
  Administered 2013-07-25: 1000 mg via INTRAVENOUS
  Filled 2013-07-25: qty 10

## 2013-07-25 NOTE — ED Notes (Signed)
Dr. Judithann Graves and Dr. Lenna Sciara at the bedside. Patient witness having seizure activity with left arm.  0.5mg  of Ativan given until Keppra comes from pharmacy.  Nasal airway placed in right nare, with use of non-rebreather.

## 2013-07-25 NOTE — ED Notes (Signed)
MD at bedside. 

## 2013-07-25 NOTE — ED Notes (Signed)
Pt from home, c/o new onset seizure. Per ems family on scene states tonic/clonic seizure activity. Pt received 2.5 midazolam, had another seizure w/ ems. Pt received additional 2.5 midazolam. Pt post ictal, not responding at this time. Airway intact.

## 2013-07-25 NOTE — ED Notes (Signed)
Called Pharmacy to verify that rush has been placed for dilantin.

## 2013-07-25 NOTE — H&P (Signed)
[facility] History and Physical  DLISA BARNWELL NIO:[id number] DOB: October 28, 1920 DOA: 07/25/2013  Referring physician: EDP PCP: Elyn Peers, MD   Chief Complaint: Seizures   HPI: [name] is a 80 y.o. female with h/o metastatic breast cancer.  She just found out 2 days ago that this had metastasized to brain (she was hospitalized on our service at that time although this was for positive troponins, and positive PET scan for mets, not seizures at this time).  At that time she made her wishes clear to family and physicians: no additional aggressive treatment for BRCA, DNI/DNR, no feeding tube, and requested discharge from the hospital.  Today at 3 PM, the patient began having seizure activity, no return to baseline.  Continues to be in status epilepticus some 5 or more hours later in the ED before finally being brought out of it by medications.  Review of Systems: Unable to perform.  Past Medical History  Diagnosis Date  . Hypertension   . Diabetes mellitus without complication   . Breast cancer     a. Metastatic triple negative breast CA. S/p L lumpectomy/sentinal node bx ~1996. Increasing mass 2014 with subsequent dx triple negative invasive ductal carcinoma with lymphovascular invasion. PET with mets in bone and lung. Pt opted for no tx.  . Lower GI bleed     a. 2007/2012 -  with associated anemia. Suspected diverticular bleed or from her arteriovenous malformations that were seen on prior colonscopy.  . Chronic anemia    Past Surgical History  Procedure Laterality Date  . Cholecystectomy  2011  . Shoulder open rotator cuff repair    . Breast lumpectomy Left     with alnd, followed by xrt   Social History:  reports that she has never smoked. She does not have any smokeless tobacco history on file. She reports that she does not drink alcohol or use illicit drugs.  No Known Allergies  Family History  Problem Relation Age of Onset  . Cancer Mother     breast  ca  . Stroke Father   . Heart disease Father      Prior to Admission medications   Medication Sig Start Date End Date Taking? Authorizing Provider  amLODipine (NORVASC) 5 MG tablet Take 5 mg by mouth daily.  01/16/13  Yes Historical Provider, MD  aspirin EC 81 MG tablet Take 81 mg by mouth daily.   Yes Historical Provider, MD  naproxen sodium (ALEVE) 220 MG tablet Take 220 mg by mouth every 4 (four) hours as needed (pain).   Yes Historical Provider, MD  saxagliptin HCl (ONGLYZA) 5 MG TABS tablet Take 5 mg by mouth daily.   Yes Historical Provider, MD  glucose blood test strip 1 each by Other route as needed for other. Use as instructed    Historical Provider, MD   Physical Exam: Filed Vitals:   07/25/13 2123  BP: 95/46  Pulse: 79  Temp:   Resp: 21    BP 95/46  Pulse 79  Temp(Src) 97.9 F (36.6 C)  Resp 21  Ht 5' 4.96" (1.65 m)  Wt 73.7 kg (162 lb 7.7 oz)  BMI 27.07 kg/m2  SpO2 99%  General Appearance:    Unresponsive, no following of commands, appears stated age  Head:    Normocephalic, atraumatic  Eyes:    Pinpoint pupils, non reactive, deviated to the Left      Nose:   Nares without drainage or epistaxis. Mucosa, turbinates normal  Throat:  Moist mucous membranes. Oropharynx without erythema or exudate.  Neck:   Supple. No carotid bruits.  No thyromegaly.  No lymphadenopathy.   Back:     No CVA tenderness, no spinal tenderness  Lungs:     Coarse inspiration, worrisome for aspiration and failure to guard airway.  Chest wall:    No tenderness to palpitation  Heart:    Regular rate and rhythm without murmurs, gallops, rubs  Abdomen:     Soft, non-tender, nondistended, normal bowel sounds, no organomegaly  Genitalia:    deferred  Rectal:    deferred  Extremities:   No clubbing, cyanosis or edema.  Pulses:   2+ and symmetric all extremities  Skin:   Skin color, texture, turgor normal, no rashes or lesions  Lymph nodes:   Cervical, supraclavicular, and axillary nodes  normal  Neurologic:   GCS 3, left side clonic    Labs on Admission:  Basic Metabolic Panel:  Recent Labs Lab 07/23/13 1530 07/23/13 1900 07/24/13 0315  NA 138  --  138  K 5.0  --  4.2  CL 100  --  103  CO2 22  --  19  GLUCOSE 100*  --  99  BUN 20  --  16  CREATININE 1.31*  --  1.18*  CALCIUM 10.3  --  9.2  MG  --  1.5  --   PHOS  --  2.8  --    Liver Function Tests:  Recent Labs Lab 07/23/13 1530  AST 30  ALT 6  ALKPHOS 71  BILITOT 0.7  PROT 7.3  ALBUMIN 3.7   No results found for this basename: LIPASE, AMYLASE,  in the last 168 hours No results found for this basename: AMMONIA,  in the last 168 hours CBC:  Recent Labs Lab 07/23/13 1530 07/24/13 0315  WBC 7.1 5.9  NEUTROABS 4.9  --   HGB 15.5* 14.7  HCT 46.7* 45.7  MCV 64.1* 65.0*  PLT 529* 499*   Cardiac Enzymes:  Recent Labs Lab 07/23/13 1530 07/23/13 1900 07/23/13 2330  TROPONINI 0.30* 0.39* 0.32*    BNP (last 3 results) No results found for this basename: PROBNP,  in the last 8760 hours CBG:  Recent Labs Lab 07/23/13 2142 07/25/13 1950  GLUCAP 91 139*    Radiological Exams on Admission: No results found.  EKG: Independently reviewed.  Assessment/Plan Principal Problem:   Status epilepticus Active Problems:   Seizure   Refractory complex partial status epilepticus   1. Status epilepticus - 5 hour duration status, finally alleviated with 3 seizure meds, admitting patient to med surg seizure med recs per neurology (please see their note).  Unfortunately patient has very poor prognosis of recovery after 5 hour seizure down time, though we will give her a chance to do so, if she makes meaningful recovery then will re-evaluate.  Otherwise if no recovery then anticipate palliative care consultation and hospice.  Discussed with family and patient had made it clear to them: she is DNI/DNR, no feeding tube, etc.  Focusing on comfort measures at this point. 2. Concern for aspiration -  patient with nasal trumpet in place, intubation contraindicate by patients wishes, will monitor for development of PNA.  Vital signs only at this point unless she wakes up, will hold of on lab draws.    Code Status: DNR/DNI, no feeding tube  Family Communication: Spoke with family in conference room Disposition Plan: Admit to inpatient   Time spent: 21 min  GARDNER, JARED M.  [facility] Pager [phone]  If 7AM-7PM, please contact the day team taking care of the patient [email or web address] Password [facility] 07/25/2013, 9:37 PM

## 2013-07-25 NOTE — ED Notes (Signed)
Dr. Kirkpatrick at the bedside.  

## 2013-07-25 NOTE — ED Notes (Signed)
Called Pharmacy in regards to [place]. He will bring medication.

## 2013-07-25 NOTE — ED Provider Notes (Signed)
CSN: [id number]     Arrival date & time 07/25/13  1853 History   First MD Initiated Contact with Patient 07/25/13 1920     Chief Complaint  Patient presents with  . Seizures    Patient is a 80 y.o. female presenting with seizures.  Seizures Seizure activity on arrival: yes   Seizure type:  Focal Preceding symptoms comment:  Unable to specify Initial focality:  Left-sided Episode characteristics: abnormal movements, apnea, focal shaking and unresponsiveness   Postictal symptoms: somnolence   Return to baseline: no   Severity:  Moderate Duration:  4 minutes Timing:  Intermittent Number of seizures this episode:  4 Progression:  Worsening Context: intracranial lesion   Recent head injury:  No recent head injuries PTA treatment:  Midazolam History of seizures: no       Past Medical History  Diagnosis Date  . Hypertension   . Diabetes mellitus without complication   . Breast cancer     a. Metastatic triple negative breast CA. S/p L lumpectomy/sentinal node bx ~1996. Increasing mass 2014 with subsequent dx triple negative invasive ductal carcinoma with lymphovascular invasion. PET with mets in bone and lung. Pt opted for no tx.  . Lower GI bleed     a. 2007/2012 -  with associated anemia. Suspected diverticular bleed or from her arteriovenous malformations that were seen on prior colonscopy.  . Chronic anemia    Past Surgical History  Procedure Laterality Date  . Cholecystectomy  2011  . Shoulder open rotator cuff repair    . Breast lumpectomy Left     with alnd, followed by xrt   Family History  Problem Relation Age of Onset  . Cancer Mother     breast ca  . Stroke Father   . Heart disease Father    History  Substance Use Topics  . Smoking status: Never Smoker   . Smokeless tobacco: Not on file  . Alcohol Use: No   OB History   Grav Para Term Preterm Abortions TAB SAB Ect Mult Living                 Review of Systems  Unable to perform ROS: Patient nonverbal   Neurological: Positive for seizures.      Allergies  Review of patient's allergies indicates no known allergies.  Home Medications   Current Outpatient Rx  Name  Route  Sig  Dispense  Refill  . amLODipine (NORVASC) 5 MG tablet   Oral   Take 5 mg by mouth daily.          Marland Kitchen aspirin EC 81 MG tablet   Oral   Take 81 mg by mouth daily.         Marland Kitchen glucose blood test strip   Other   1 each by Other route as needed for other. Use as instructed         . ONGLYZA 5 MG TABS tablet                BP 111/59  Pulse 101  Resp 22  SpO2 96% Physical Exam  Constitutional: She appears well-developed and well-nourished. No distress.  Obese   HENT:  Head: Normocephalic and atraumatic.  Nose: Nose normal.  Mouth/Throat: Oropharynx is clear and moist.  Eyes: EOM are normal. Pupils are equal, round, and reactive to light.  Neck: Normal range of motion. Neck supple. No tracheal deviation present.  Cardiovascular: Regular rhythm and intact distal pulses.   Murmur heard. Tachycardic  Pulmonary/Chest: Effort normal and breath sounds normal. She has no rales.  Abdominal: Soft. Bowel sounds are normal. She exhibits no distension. There is no tenderness. There is no rebound and no guarding.  Musculoskeletal: Normal range of motion. She exhibits no tenderness.  Neurological:  Unresponsive.   Focal clonic movement of LUE.   Skin: Skin is warm and dry. No rash noted.  Psychiatric: She has a normal mood and affect. Her behavior is normal.    ED Course  Procedures (including critical care time) Labs Review Labs Reviewed  CBG MONITORING, ED - Abnormal; Notable for the following:    Glucose-Capillary 139 (*)    All other components within normal limits   Imaging Review No results found.   EKG Interpretation   Date/Time:  Friday July 25 2013 19:01:19 EST Ventricular Rate:  100 PR Interval:  128 QRS Duration: 76 QT Interval:  356 QTC Calculation: 459 R Axis:    108 Text Interpretation:  Sinus tachycardia Right axis deviation Rightward  axis New since previous tracing Confirmed by Winfred Leeds  MD, SAM [phone])  on 07/25/2013 7:21:33 PM      MDM   Final diagnoses:  Status epilepticus  Metastatic cancer to brain  Refractory complex partial status epilepticus  Seizure     80 year old with history recurrent invasive ductal carcinoma that is triple negative in left breast. As well as history of metastatic disease on PET scan and to bone and lung. She was recently hospitalized and underwent CT and MRI of brain which demonstrated mets to the brain. Today the patient had a seizure at home and EMS was called. Per EMS patietn had tonic/clonic generalized seizure they administered 5 mg of versed. Since then patient was post ictal.   9:53 PM During Ed course the patient has intermittently had clonic movements of her LUE. Ativan given. Loaded with Keppra.  Dr. Rodney Booze at bedside. Plan to admit for further care. Patient is DNR/DNI. Family wants comfort care only. Case discussed with Dr. Alcario Drought. Patient admitted.  Family up dated multiple times throughout Ed course.    Ruthell Rummage, MD 07/25/13 2200

## 2013-07-25 NOTE — ED Notes (Signed)
Family in consultation room A

## 2013-07-25 NOTE — ED Notes (Signed)
Family at bedside. 

## 2013-07-25 NOTE — Consult Note (Signed)
Neurology Consultation Reason for Consult: Seizures Referring Physician: Lyn Hollingshead  CC: Seizures  History is obtained from: Family  HPI: [name] is a 80 y.o. female with a history of metastatic breast cancer who recently found out that she had brain metastasis. She has known for the past few months that she had return of cancer, but had elected to not pursue any aggressive treatment.  Today around 3 PM, the patient began having seizure activity since that time she has not returned to baseline. In the emergency room she has left arm clonic activity with eye deviation.   I had a long discussion with family regarding the difficulty stopping a seizure that had been going on for likely 5-1/2 hours. After discussing with the family, they favored trying to make her comfortable as long as that included continuing to get the seizures stopped. She is clearly indicated that she did not want intubation or chest compressions.     ROS: Unable to assess secondary to patient's altered mental status.    Past Medical History  Diagnosis Date  . Hypertension   . Diabetes mellitus without complication   . Breast cancer     a. Metastatic triple negative breast CA. S/p L lumpectomy/sentinal node bx ~1996. Increasing mass 2014 with subsequent dx triple negative invasive ductal carcinoma with lymphovascular invasion. PET with mets in bone and lung. Pt opted for no tx.  . Lower GI bleed     a. 2007/2012 -  with associated anemia. Suspected diverticular bleed or from her arteriovenous malformations that were seen on prior colonscopy.  . Chronic anemia     Family History: Unable to assess secondary to patient's altered mental status.   Social History: Tob: Unable to assess secondary to patient's altered mental status.   Exam: Current vital signs: BP 153/92  Pulse 121  Resp 20  SpO2 97% Vital signs in last 24 hours: Pulse Rate:  [101-152] 121 (03/06 2008) Resp:  [20-35] 20 (03/06  2008) BP: (111-176)/(59-92) 153/92 mmHg (03/06 2000) SpO2:  [86 %-100 %] 97 % (03/06 2008)  General: in bed, seizing CV: tachycardic Mental Status: Patient is unresponsive, does not follow commands. Cranial Nerves: II: She does not blink to threat.  Pupils are equal, round, and reactive to light.  Discs are difficult to visualize. III,IV, VI:  eyes are disconjugate he, with right eye down the deviated compared to the left. With doll's eye they do move to the right, but not to the left. V,VII:  difficult to assess corneal reflex given frequent blinking due to seizure  VIII, X, XI, XII: Unable to assess secondary to patient's altered mental status.  Motor: Tone is increased, clonic movements are persistent in the left arm and leg. There are clonic movements to a lesser degree in the right leg  Sensory: She does not respond to noxious stimuli  Deep Tendon Reflexes: Unable to obtain due to increased tone  Cerebellar: Unable to assess secondary to patient's altered mental status.  Gait: Unable to assess secondary to patient's altered mental status.    I have reviewed labs in epic and the results pertinent to this consultation are: Mildly elevated troponin  BMP-unremarkable  I have reviewed the images obtained:MRI brain from 3/4 multiple metastasis, with a right frontal lesion with significant edema around it.   Impression: 80 year old female presenting with persistent status epilepticus in the setting of brain metastasis from cancer which is not undergoing active treatment. Given the prolonged nature of the status epilepticus despite  multiple antiepileptics, there is a significant chance that it would not stopped short of burst suppression. After discussing with the family, indicated that do not want to pursue aggressive measures such as induced coma or intubation.  At this time, they favor focusing on comfort as long as this includes treatment of seizure in the short-term. I indicated  that I will aggressively treat the seizures short of intubation. There is a risk that with medications for seizure control, the patient could have respiratory compromise and the family is aware that this could be fatal. There is also a risk of not being able to control the seizure.    Recommendations: 1) phenytoin 20 mg per kilogram load 2) already received  Keppra 1 g 3) received 5 mg Versed and 1.5 mg Ativan 4) will continue keppra 1gm BID.  5) continue with dilantin 100mg  TID.  6) if no response to dilantin, will consider load with depakote.  7) will give additional 1mg  IV ativan.  8) Would focus more on comfort than other aggressive interventions unless the patient makes a dramatic recovery.    Roland Rack, MD [facility] [phone]  If 7pm- 7am, please page neurology on call at [phone].

## 2013-07-25 NOTE — Progress Notes (Signed)
Chaplain responded to ED for family support in patient's room and consultation room A. Patient's pastors present with patient and family. All were grateful for chaplain support, but said they have no needs at this time. Provided emotional support, empathic listening, and caring presence.   [name] [phone]

## 2013-07-25 NOTE — ED Provider Notes (Signed)
Medical screening examination/treatment/procedure(s) were conducted as a shared visit with non-physician practitioner(s) and myself.  I personally evaluated the patient during the encounter.   EKG Interpretation   Date/Time:  Wednesday July 23 2013 16:48:53 EST Ventricular Rate:  82 PR Interval:  110 QRS Duration: 78 QT Interval:  392 QTC Calculation: 458 R Axis:   71 Text Interpretation:  Sinus rhythm Borderline short PR interval Low  voltage, precordial leads ED PHYSICIAN INTERPRETATION AVAILABLE IN [facility] Confirmed by TEST, Record ([id number]) on 07/25/2013 7:23:53 AM      Patient with worsening AMS and delirium. Possibly from infection vs likely new brain met. Will need admission to medicine.. Stable at this time.  Ephraim Hamburger, MD 07/25/13 [phone]

## 2013-07-25 NOTE — ED Provider Notes (Signed)
7:30 PM patient had first seizure today. Patient actively seizing with tonic-clonic movement of left arm and eyes deviated to left. Unresponsive. Gag reflex minimal. Nasal airway  inserted the right nares by me. She maintained a pulse ox of 98-100% on nasal cannula oxygen. I've had a lengthy discussion with her grandson. Patient is a DO NOT RESUSCITATE, DO NOT INTUBATE  .she is known to have breast cancer with metastases to brain. Intravenous Keppra loading dose orderd.spoke with Dr. Leonel Ramsay will come to evaluate patient.  8:03 PM patient continues to have tonic-clonic seizures most predominantly with left upper extremity. She remained unresponsive. Decadron 10 mg IV ordered.   9 PM no seizure activity after treatment with Keppra and Ativan. Presently Glasgow Coma Score 3 Dr Hurley Cisco conslted to admit Dx  Status epilepticus CRITICAL CARE Performed by: Orlie Dakin Total critical care time: 30 minutes Critical care time was exclusive of separately billable procedures and treating other patients. Critical care was necessary to treat or prevent imminent or life-threatening deterioration. Critical care was time spent personally by me on the following activities: development of treatment plan with patient and/or surrogate as well as nursing, discussions with consultants, evaluation of patient's response to treatment, examination of patient, obtaining history from patient or surrogate, ordering and performing treatments and interventions, ordering and review of laboratory studies, ordering and review of radiographic studies, pulse oximetry and re-evaluation of patient's condition.  Orlie Dakin, MD 07/25/13 2102

## 2013-07-26 DIAGNOSIS — IMO0001 Reserved for inherently not codable concepts without codable children: Secondary | ICD-10-CM

## 2013-07-26 LAB — PHENYTOIN LEVEL, TOTAL: PHENYTOIN LVL: 27.6 ug/mL — AB (ref 10.0–20.0)

## 2013-07-26 MED ORDER — LORAZEPAM 2 MG/ML IJ SOLN
0.5000 mg | INTRAMUSCULAR | Status: DC | PRN
  Administered 2013-07-26 – 2013-07-28 (×6): 0.5 mg via INTRAVENOUS
  Filled 2013-07-26 (×6): qty 1

## 2013-07-26 MED ORDER — ACETAMINOPHEN 650 MG RE SUPP
650.0000 mg | RECTAL | Status: DC | PRN
  Administered 2013-07-26: 650 mg via RECTAL
  Filled 2013-07-26: qty 1

## 2013-07-26 NOTE — Progress Notes (Signed)
Patients family reports increasing agitation from patient writer in noted patient throwing legs over rail pulling covers off sitting up in bed. Call to M.D  Palliative care  Dr. Jerilynn Mages. Lovena Le who spoke with family and will order med for agitation.

## 2013-07-26 NOTE — Progress Notes (Signed)
Paged Dr. Alcario Drought about cardiac monitoring last night around 2300. Stated the pt doesn't need to be on tele because family did not want to take aggressive measures, only conservative. Will continue to monitor.

## 2013-07-26 NOTE — Consult Note (Signed)
Patient UV:[id number] A Ishii      DOB: 1920/11/19      EEA:[id number]  Summary of Goals of Care ; met with Grandaughter Delia, [name] and Bishop Broadnax  .   Her grandson will not be present his name is Janace Aris. These are the surrogates for [facility]. Nasrin is in and out of confused thought.  She has told her family that the [facility] already came for her but she is still seeing everyone.     Family desires comfort care but not to hasten her time of death.  They would like to consider  [facility] referral.  They would like to treat her pain but not with opiates unless absolutely necessary.  I removed her nasal trumpet as she is much more stable then earlier.  Recommend:  1.  DNR  2.  Pain generalized: Tylenol prn and air mattress overlay  3.  Agree with speech eval before eating . She is in and out of sleep and may not be safe to feed right now.  Moisten mouth with sponges  4. Prognosis likely poor, continue to monitor.   5.Seizures: continue keppra and dilantin.  If IV access is lost may need to consider liquid medications if she is able to swallow or place a picc for long term pain control.  IV just replaced.  Total time:  410 - 446 pm  Asami Lambright L. Lovena Le, MD MBA The Palliative Medicine Team at [facility] Phone: [phone] Pager: [phone]

## 2013-07-26 NOTE — Progress Notes (Signed)
Subjective: Patient is excretion or drainage seizure activity since seizures were brought under control. Patient had no complaints.  Objective: Current vital signs: BP 160/87  Pulse 80  Temp(Src) 97.6 F (36.4 C) (Oral)  Resp 20  Ht 5' 4.96" (1.65 m)  Wt 73.7 kg (162 lb 7.7 oz)  BMI 27.07 kg/m2  SpO2 96%  Neurologic Exam: Drowsy but communicative. Patient was slightly disoriented to place, but oriented fairly well the time. Speech was commensurate with level of alertness. Patient moved extremities equally with normal strength throughout and normal muscle tone was well.  Medications: I have reviewed the patient's current medications.  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  Recommend no changes in current management. Dilantin level will be ordered and Dilantin adjusted as needed.  C.R. Nicole Kindred, MD [facility] [phone]  07/26/2013  4:37 PM

## 2013-07-26 NOTE — Progress Notes (Addendum)
PROGRESS NOTE  [name] ZOX:[id number] DOB: August 29, 1920 DOA: 07/25/2013 PCP: Elyn Peers, MD  Assessment/Plan: Status epilepticus - 5 hour duration -patient has very poor prognosis of recovery after 5 hour seizure down time, though we will give her a chance to do so- IV seizure meds -palliative care consultation- goals somewhat clear: she is DNI/DNR, no feeding tube, etc. -asked for SLP eval as patient is more awake than at admission -family would want to take home with hospice  metastatic cancer to brain H/o breast cancer  Concern for aspiration - patient with nasal trumpet in place, intubation contraindicated by patients wishes, will monitor for development of PNA.       Code Status: DNR Family Communication: granddaughter and "bishop" at bedside Disposition Plan:    Consultants:  Neuro  Palliative care  Procedures:    Antibiotics:    HPI/Subjective: Speaking today but difficult to understand  Objective: Filed Vitals:   07/25/13 2237  BP: 89/42  Pulse: 78  Temp: 98.1 F (36.7 C)  Resp: 18   No intake or output data in the 24 hours ending 07/26/13 0845 Filed Weights   07/25/13 2041  Weight: 73.7 kg (162 lb 7.7 oz)    Exam:   General:  Opens eyes, not following commands  Cardiovascular: rrr  Respiratory: clear anterior  Abdomen: +BS, soft  Musculoskeletal: not following commands, no edema  Data Reviewed: Basic Metabolic Panel:  Recent Labs Lab 07/23/13 1530 07/23/13 1900 07/24/13 0315 07/25/13 2150  NA 138  --  138 140  K 5.0  --  4.2 4.0  CL 100  --  103 104  CO2 22  --  19  --   GLUCOSE 100*  --  99 190*  BUN 20  --  16 17  CREATININE 1.31*  --  1.18* 1.50*  CALCIUM 10.3  --  9.2  --   MG  --  1.5  --   --   PHOS  --  2.8  --   --    Liver Function Tests:  Recent Labs Lab 07/23/13 1530  AST 30  ALT 6  ALKPHOS 71  BILITOT 0.7  PROT 7.3  ALBUMIN 3.7   No results found for this basename: LIPASE, AMYLASE,   in the last 168 hours No results found for this basename: AMMONIA,  in the last 168 hours CBC:  Recent Labs Lab 07/23/13 1530 07/24/13 0315 07/25/13 2143 07/25/13 2150  WBC 7.1 5.9 6.6  --   NEUTROABS 4.9  --  5.7  --   HGB 15.5* 14.7 14.8 17.0*  HCT 46.7* 45.7 45.5 50.0*  MCV 64.1* 65.0* 65.6*  --   PLT 529* 499* 517*  --    Cardiac Enzymes:  Recent Labs Lab 07/23/13 1530 07/23/13 1900 07/23/13 2330  TROPONINI 0.30* 0.39* 0.32*   BNP (last 3 results) No results found for this basename: PROBNP,  in the last 8760 hours CBG:  Recent Labs Lab 07/23/13 2142 07/25/13 1950  GLUCAP 91 139*    No results found for this or any previous visit (from the past 240 hour(s)).   Studies: No results found.  Scheduled Meds: . levETIRAcetam  1,000 mg Intravenous Q12H  . phenytoin (DILANTIN) IV  100 mg Intravenous 3 times per day   Continuous Infusions: . sodium chloride 1,000 mL (07/25/13 2325)   Antibiotics Given (last 72 hours)   None      Principal Problem:   Status epilepticus Active Problems:   Seizure  Refractory complex partial status epilepticus    Time spent: [place], [facility] Pager [phone]. If 7PM-7AM, please contact night-coverage at [email or web address], password [facility] 07/26/2013, 8:45 AM  LOS: 1 day

## 2013-07-26 NOTE — ED Provider Notes (Signed)
I have personally seen and examined the patient.  I have discussed the plan of care with the resident.  I have reviewed the documentation on PMH/FH/Soc. History.  I have reviewed the documentation of the resident and agree.  Orlie Dakin, MD 07/26/13 0002

## 2013-07-26 NOTE — Progress Notes (Signed)
Patient MA:[id number] A Retz      DOB: 1921/02/08      FSE:[id number]  Called by nursing and spoke with family member regarding patient being agitated and increasingly confused. Trying to get out of bed, swinging legs over bed.  Ativan 0.5 mg q 4 hrs prn ordered will monitor.  Avoiding Haldol which can lower the seizure threshold.   Alena Blankenbeckler L. Lovena Le, MD MBA The Palliative Medicine Team at [facility] Phone: [phone] Pager: [phone]

## 2013-07-26 NOTE — Consult Note (Signed)
Patient [name]      DOB: Sep 09, 1920      DXI:[id number]     Consult Note from the Palliative Medicine Team at [place] Requested by: Dr. Eliseo Squires    PCP: Elyn Peers, MD Reason for Consultation:  [place]   Phone Number:[phone]  Assessment of patients Current state:80 yr old african [place] female with known metatstatic breast cancer was diagnosed as an out patient with brain mets earlier this week. Per hospitalist note patient at that time was able to express goals of care as DNR, no feeding tubes , nothing to prolong her life artificially.  She presented to the hospital with status epilepticus (5 hours).   I met with her granddaughter , daughter and pastor and affirmed the above.  Goals for dignity and comfort and attempt to transition to hospice facility (specified [facility]).  Patient had coplained of headache and being generally sore.  Seizures are currently under control.  Patient has intermittent wakefullness.   Goals of Care: 1.  Code Status: DNR   2. Scope of Treatment: 1. General muscle pain post seizures-  Tylenol and add air mattress 2. Comfort feed best texture. Speech to see 3. Seizures: continue dilantin and keppra   4. Disposition: Hospice facility when bed available   3. Symptom Management:   1. Anxiety/Agitation: ativan low dose prn if needed 2. Pain: Tylenol suppository prn  3. Bowel Regimen: monitor  4. Psychosocial: Very independent prior to her diagnosis , very active in her church.  5. Spiritual:  Her pastor is at the bedside.        Patient Documents Completed or Given: Document Given Completed  Advanced Directives Pkt    MOST    DNR    Gone from My Sight    Hard Choices      Brief HPI:  80 yr old admitted with seizures in the context of known metastatic breast cancer.  We were asked to assist with goals of care.   ROS: confabulates some of  Her answers, denies , n, v, dizziness, falling asleep.    PMH:   Past Medical History  Diagnosis Date  . Hypertension   . Diabetes mellitus without complication   . Breast cancer     a. Metastatic triple negative breast CA. S/p L lumpectomy/sentinal node bx ~1996. Increasing mass 2014 with subsequent dx triple negative invasive ductal carcinoma with lymphovascular invasion. PET with mets in bone and lung. Pt opted for no tx.  . Lower GI bleed     a. 2007/2012 -  with associated anemia. Suspected diverticular bleed or from her arteriovenous malformations that were seen on prior colonscopy.  . Chronic anemia      PSH: Past Surgical History  Procedure Laterality Date  . Cholecystectomy  2011  . Shoulder open rotator cuff repair    . Breast lumpectomy Left     with alnd, followed by xrt   I have reviewed the FH and SH and  If appropriate update it with new information. No Known Allergies Scheduled Meds: . levETIRAcetam  1,000 mg Intravenous Q12H  . phenytoin (DILANTIN) IV  100 mg Intravenous 3 times per day   Continuous Infusions: . sodium chloride 75 mL/hr at 07/26/13 1236   PRN Meds:.acetaminophen    BP 160/87  Pulse 80  Temp(Src) 97.6 F (36.4 C) (Oral)  Resp 20  Ht 5' 4.96" (1.65 m)  Wt 73.7 kg (162 lb 7.7 oz)  BMI 27.07 kg/m2  SpO2 96%   PPS: 10-20 %  No intake or output data in the 24 hours ending 07/26/13 1646   Physical Exam:  General: Intermittently awake when stimulated, falls off to sleep when not engaging with people HEENT:  PERRL, EOMI, mmm, speech slightly slurred Chest:   Decreased but clear anteriorly clear at this time CVS: regular, rate and rhythm, s1, s2 no MRG Abdomen: obese soft not tender, no grimace on palpation Ext: decreased strength left side, but now moving all extremities Neuro:awake able to identify her pastor, church name and address, family members, at times she has been confused and tried to get up. She does not understand that she had seizures.  Labs: CBC    Component Value Date/Time    WBC 6.6 07/25/2013 2143   WBC 4.4 05/09/2013 1043   RBC 6.94* 07/25/2013 2143   RBC 6.99* 05/09/2013 1043   RBC 3.55* 08/09/2008 0435   HGB 17.0* 07/25/2013 2150   HGB 14.9 05/09/2013 1043   HCT 50.0* 07/25/2013 2150   HCT 47.0* 05/09/2013 1043   PLT 517* 07/25/2013 2143   PLT 548* 05/09/2013 1043   MCV 65.6* 07/25/2013 2143   MCV 67.2* 05/09/2013 1043   MCH 21.3* 07/25/2013 2143   MCH 21.3* 05/09/2013 1043   MCHC 32.5 07/25/2013 2143   MCHC 31.7 05/09/2013 1043   RDW 17.7* 07/25/2013 2143   RDW 20.2* 05/09/2013 1043   LYMPHSABS 0.5* 07/25/2013 2143   LYMPHSABS 1.0 05/09/2013 1043   MONOABS 0.3 07/25/2013 2143   MONOABS 0.5 05/09/2013 1043   EOSABS 0.1 07/25/2013 2143   EOSABS 0.1 05/09/2013 1043   BASOSABS 0.0 07/25/2013 2143   BASOSABS 0.0 05/09/2013 1043    CMP     Component Value Date/Time   NA 140 07/25/2013 2150   NA 141 05/09/2013 1042   K 4.0 07/25/2013 2150   K 4.8 05/09/2013 1042   CL 104 07/25/2013 2150   CO2 19 07/24/2013 0315   CO2 27 05/09/2013 1042   GLUCOSE 190* 07/25/2013 2150   GLUCOSE 110 05/09/2013 1042   BUN 17 07/25/2013 2150   BUN 19.0 05/09/2013 1042   CREATININE 1.50* 07/25/2013 2150   CREATININE 1.5* 05/09/2013 1042   CALCIUM 9.2 07/24/2013 0315   CALCIUM 10.1 05/09/2013 1042   PROT 7.3 07/23/2013 1530   PROT 7.2 05/09/2013 1042   ALBUMIN 3.7 07/23/2013 1530   ALBUMIN 3.9 05/09/2013 1042   AST 30 07/23/2013 1530   AST 24 05/09/2013 1042   ALT 6 07/23/2013 1530   ALT 11 05/09/2013 1042   ALKPHOS 71 07/23/2013 1530   ALKPHOS 62 05/09/2013 1042   BILITOT 0.7 07/23/2013 1530   BILITOT 0.72 05/09/2013 1042   GFRNONAA 39* 07/24/2013 0315   GFRAA 46* 07/24/2013 0315    Chest Xray Reviewed/Impressions:  Innumerable bilateral pulmonary nodules consistent with metastatic  disease.    CT scan of the Head Reviewed/Impressions:  1. There is no evidence of an acute intracranial hemorrhage.  2. There is hypodensity in the right frontal deep white matter  asymmetric the with that on the left  which may reflect edema or  encephalomalacia. The possibility of metastatic disease is raised. A  follow-up MRI is recommended.  3. There is moderate diffuse cerebral and cerebellar atrophy  consistent with chronic small vessel ischemia.   MR brain: 1. 11 x 11 x 8 mm enhancing lesion in the anterior right frontal  lobe is most concerning for a breast cancer metastasis.  2. 4 mm  metastasis within the left caudate head.  3. Scattered periventricular white matter disease is worse left than  right. This likely reflects the sequelae of chronic microvascular  ischemia.    Time In Time Out Total Time Spent with Patient Total Overall Time  410 pm 446 pm 15 min 36 min    Greater than 50%  of this time was spent counseling and coordinating care related to the above assessment and plan.   Kalayna Noy L. Lovena Le, MD MBA The Palliative Medicine Team at [facility] Phone: [phone] Pager: [phone]

## 2013-07-27 DIAGNOSIS — E860 Dehydration: Secondary | ICD-10-CM

## 2013-07-27 DIAGNOSIS — Z515 Encounter for palliative care: Secondary | ICD-10-CM

## 2013-07-27 MED ORDER — PHENYTOIN SODIUM 50 MG/ML IJ SOLN
100.0000 mg | Freq: Three times a day (TID) | INTRAMUSCULAR | Status: DC
  Administered 2013-07-27 – 2013-07-29 (×7): 100 mg via INTRAVENOUS
  Filled 2013-07-27 (×6): qty 2

## 2013-07-27 MED ORDER — SODIUM CHLORIDE 0.9 % IJ SOLN
10.0000 mL | INTRAMUSCULAR | Status: DC | PRN
  Administered 2013-07-28: 10 mL

## 2013-07-27 NOTE — Progress Notes (Signed)
SLP Cancellation Note  Patient Details Name: [name] MRN: [id number] DOB: Oct 04, 1920   Cancelled treatment:   ST received order for BSE.  Evaluation deferred for 07/28/13. Sharman Crate [place], [place] [facility] 07/27/2013, 6:07 PM

## 2013-07-27 NOTE — Progress Notes (Addendum)
Patient [id number] [name]      DOB: 07-25-1920      DJM:[id number]   Palliative Medicine Team at [facility] Progress Note    Subjective:  Patient sleeping soundly.  Had some agitation last evening.  Family has requested that she not be stuck for labs.  We talked about putting Monique PICC in place to prevent further need for painful sticks and to facilitate symptom management with antiepileptics.  Family also requested that only [name] and [name] make decisions for the patient.  [name] had been included in goals of care but is not [name] , which was clarified today.  Family remains open to transition to hospice facility when bed available.     Filed Vitals:   07/27/13 1023  BP: 154/65  Pulse: 79  Temp: 97.5 F (36.4 C)  Resp: 20   Physical exam:   Deferred due to patient sleeping     Assessment and plan: 80 yr old with metastatic breast cancer admitted with status epilepticus.  Had been more awake last evening, but became agitated .  We have asked to limit stimulation by church members as their has been Monique roomful which generally is getting her agitated. Family clarified decision makers despite including another family member- [name] and Marc Morgans should make decisions on behalf of this patient.  1.  DNR  2.  Seizures secondary to Metastatic Breast Cancer- continue Keppra and Dilantin  3.  Consider obtaining PICC line which will be helpful in symptom management  4.  Social work consult for [employer] placment   Total time 20 min  Greater than 50% of this time was spent counseling and coordinating care.  Thamar Holik L. Lovena Le, MD MBA The Palliative Medicine Team at [facility] Phone: [phone] Pager: [phone]

## 2013-07-27 NOTE — Progress Notes (Signed)
PROGRESS NOTE  [name] HUD:[id number] DOB: 13-Oct-1920 DOA: 07/25/2013 PCP: Elyn Peers, MD  Assessment/Plan: Status epilepticus - 5 hour duration -patient has very poor prognosis of recovery after 5 hour seizure down time, though we will give her a chance to do so- IV seizure meds -palliative care consultation- comfort care -PICC line -asked for SLP eval as patient is more awake than at admission -ativan PRN -[facility]  metastatic cancer to brain H/o breast cancer  Concern for aspiration - patient with nasal trumpet in place, intubation contraindicated by patients wishes, will monitor for development of PNA.       Code Status: DNR Family Communication: family at bedside Disposition Plan: [facility]   Consultants:  Neuro  Palliative care  Procedures:    Antibiotics:    HPI/Subjective: sleeping  Objective: Filed Vitals:   07/27/13 0555  BP: 139/73  Pulse: 74  Temp: 97.4 F (36.3 C)  Resp: 20   No intake or output data in the 24 hours ending 07/27/13 0833 Filed Weights   07/25/13 2041  Weight: 73.7 kg (162 lb 7.7 oz)    Exam:   General:  sleeping  Cardiovascular: rrr  Respiratory: clear anterior  Abdomen: +BS, soft  Musculoskeletal: not following commands, no edema  Data Reviewed: Basic Metabolic Panel:  Recent Labs Lab 07/23/13 1530 07/23/13 1900 07/24/13 0315 07/25/13 2150  NA 138  --  138 140  K 5.0  --  4.2 4.0  CL 100  --  103 104  CO2 22  --  19  --   GLUCOSE 100*  --  99 190*  BUN 20  --  16 17  CREATININE 1.31*  --  1.18* 1.50*  CALCIUM 10.3  --  9.2  --   MG  --  1.5  --   --   PHOS  --  2.8  --   --    Liver Function Tests:  Recent Labs Lab 07/23/13 1530  AST 30  ALT 6  ALKPHOS 71  BILITOT 0.7  PROT 7.3  ALBUMIN 3.7   No results found for this basename: LIPASE, AMYLASE,  in the last 168 hours No results found for this basename: AMMONIA,  in the last 168 hours CBC:  Recent Labs Lab  07/23/13 1530 07/24/13 0315 07/25/13 2143 07/25/13 2150  WBC 7.1 5.9 6.6  --   NEUTROABS 4.9  --  5.7  --   HGB 15.5* 14.7 14.8 17.0*  HCT 46.7* 45.7 45.5 50.0*  MCV 64.1* 65.0* 65.6*  --   PLT 529* 499* 517*  --    Cardiac Enzymes:  Recent Labs Lab 07/23/13 1530 07/23/13 1900 07/23/13 2330  TROPONINI 0.30* 0.39* 0.32*   BNP (last 3 results) No results found for this basename: PROBNP,  in the last 8760 hours CBG:  Recent Labs Lab 07/23/13 2142 07/25/13 1950  GLUCAP 91 139*    No results found for this or any previous visit (from the past 240 hour(s)).   Studies: No results found.  Scheduled Meds: . levETIRAcetam  1,000 mg Intravenous Q12H  . phenytoin (DILANTIN) IV  100 mg Intravenous 3 times per day   Continuous Infusions: . sodium chloride 75 mL/hr at 07/26/13 1236   Antibiotics Given (last 72 hours)   None      Principal Problem:   Status epilepticus Active Problems:   Seizure   Refractory complex partial status epilepticus    Time spent: 35    Angeliki Mates  [facility] Pager [phone]. If 7PM-7AM, please contact night-coverage at [email or web address], password [facility] 07/27/2013, 8:33 AM  LOS: 2 days

## 2013-07-27 NOTE — Progress Notes (Addendum)
Peripherally Inserted Central Catheter/Midline Placement  The IV Nurse has discussed with the patient and/or persons authorized to consent for the patient, the purpose of this procedure and the potential benefits and risks involved with this procedure.  The benefits include less needle sticks, lab draws from the catheter and patient may be discharged home with the catheter.  Risks include, but not limited to, infection, bleeding, blood clot (thrombus formation), and puncture of an artery; nerve damage and irregular heat beat.  Alternatives to this procedure were also discussed. Consent given by granddaughter.  PICC/Midline Placement Documentation  PICC / Midline Single Lumen 99/37/16 PICC Right Basilic 45 cm 0 cm (Active)  Indication for Insertion or Continuance of Line Poor Vasculature-patient has had multiple peripheral attempts or PIVs lasting less than 24 hours;Chronic illness with exacerbations (CF, Sickle Cell, etc.) 07/27/2013 11:54 AM  Exposed Catheter (cm) 0 cm 07/27/2013 11:54 AM  Site Assessment Clean;Dry;Intact 07/27/2013 11:54 AM  Line Status Flushed;Saline locked;Blood return noted 07/27/2013 11:54 AM  Dressing Type Transparent 07/27/2013 11:54 AM  Dressing Status Clean;Dry;Intact;Antimicrobial disc in place 07/27/2013 11:54 AM  Line Care Connections checked and tightened 07/27/2013 11:54 AM  Dressing Intervention New dressing 07/27/2013 11:54 AM  Dressing Change Due 08/03/13 07/27/2013 11:54 AM       Rolena Infante 07/27/2013, 11:56 AM

## 2013-07-27 NOTE — Progress Notes (Signed)
MEDICATION RELATED CONSULT NOTE - INITIAL   Pharmacy Consult for Phenytoin Indication: Seizures  No Known Allergies  Patient Measurements: Height: 5' 4.96" (165 cm) Weight: 162 lb 7.7 oz (73.7 kg) IBW/kg (Calculated) : 56.91  Vital Signs: Temp: 97.4 F (36.3 C) (03/08 0555) Temp src: Oral (03/08 0555) BP: 139/73 mmHg (03/08 0555) Pulse Rate: 74 (03/08 0555) Intake/Output from previous day:   Intake/Output from this shift:    Labs:  Recent Labs  07/25/13 2143 07/25/13 2150  WBC 6.6  --   HGB 14.8 17.0*  HCT 45.5 50.0*  PLT 517*  --   CREATININE  --  1.50*   Estimated Creatinine Clearance: 25 ml/min (by C-G formula based on Cr of 1.5).   Phenytoin Lvl  Date Value Ref Range Status  07/26/2013 27.6* 10.0 - 20.0 ug/mL Final   Medical History: Past Medical History  Diagnosis Date  . Hypertension   . Diabetes mellitus without complication   . Breast cancer     a. Metastatic triple negative breast CA. S/p L lumpectomy/sentinal node bx ~1996. Increasing mass 2014 with subsequent dx triple negative invasive ductal carcinoma with lymphovascular invasion. PET with mets in bone and lung. Pt opted for no tx.  . Lower GI bleed     a. 2007/2012 -  with associated anemia. Suspected diverticular bleed or from her arteriovenous malformations that were seen on prior colonscopy.  . Chronic anemia     Medications:  Keppra 500 mg IV q12h Dilantin 100 mg IV q8h  Assessment: 80 yo female with new onset seizures for phenytoin.  Received phenytoin 1400 mg IV loading dose at 2030 3/6  Goal of Therapy:  Total phenytoin level 15-20  Plan:  Expect phenytoin level to continue to decrease following loading dose.  Will continue Dilantin 100 mg IV q8h for now, recheck level with am labs tomorrow morning.  Caryl Pina 07/27/2013,7:27 AM

## 2013-07-28 LAB — COMPREHENSIVE METABOLIC PANEL
ALBUMIN: 2.8 g/dL — AB (ref 3.5–5.2)
ALK PHOS: 63 U/L (ref 39–117)
ALT: 13 U/L (ref 0–35)
AST: 30 U/L (ref 0–37)
BUN: 9 mg/dL (ref 6–23)
CO2: 23 mEq/L (ref 19–32)
Calcium: 8.5 mg/dL (ref 8.4–10.5)
Chloride: 108 mEq/L (ref 96–112)
Creatinine, Ser: 0.99 mg/dL (ref 0.50–1.10)
GFR calc Af Amer: 56 mL/min — ABNORMAL LOW (ref >90)
GFR calc non Af Amer: 49 mL/min — ABNORMAL LOW (ref >90)
Glucose, Bld: 65 mg/dL — ABNORMAL LOW (ref 70–99)
POTASSIUM: 3.6 meq/L — AB (ref 3.7–5.3)
SODIUM: 145 meq/L (ref 137–147)
TOTAL PROTEIN: 5.5 g/dL — AB (ref 6.0–8.3)
Total Bilirubin: 0.3 mg/dL (ref 0.3–1.2)

## 2013-07-28 LAB — PHENYTOIN LEVEL, TOTAL: Phenytoin Lvl: 17.4 ug/mL (ref 10.0–20.0)

## 2013-07-28 MED ORDER — MORPHINE SULFATE 2 MG/ML IJ SOLN
0.5000 mg | INTRAMUSCULAR | Status: DC | PRN
  Administered 2013-07-28 – 2013-07-29 (×8): 0.5 mg via INTRAVENOUS
  Filled 2013-07-28 (×8): qty 1

## 2013-07-28 NOTE — Progress Notes (Signed)
Chaplain gave support to pt's family (2 granddaughters) through compassionate conversation and empathic listening.  Pt was asleep during chaplain's visit.   07/28/13 1300  Clinical Encounter Type  Visited With Patient and family together  Visit Type Spiritual support  Spiritual Encounters  Spiritual Needs Emotional    Estelle June, chaplain pager [phone]

## 2013-07-28 NOTE — Progress Notes (Signed)
SLP Cancellation Note  Patient Details Name: [name] MRN: [id number] DOB: 1920/06/28   Cancelled treatment:        Arrived for swallow assessment, family and Dr. Lovena Le present.  Pt. Just given pain meds and unable to stay alert for assessment.  SLP will coordinate tomorrow am for most alert time for evaluation (prior to meds if able).   Orbie Pyo [place].[employer] [phone]  07/28/2013

## 2013-07-28 NOTE — Clinical Social Work Note (Addendum)
CSW spoke with pt's granddaughter regarding CSW consult for residential hospice placement. Per pt's granddaughter (Delia), she will be arriving at [facility] around noon and would like to speak with CSW in-person. CSW to meet with Delia at bedside around noon on 07/28/2013 and present Delia with residential hospice bed placement choices.   CSW met with pt's great granddaughter at bedside and presented residential hospice placement bed choices. Per pt's great granddaughter, pt's family is agreeable to residential hospice placement at [facility]. CSW contacted [employer] who stated that [facility] was at full capacity and unable to admit pt's at this time. CSW contacted pt's granddaughter, Lars Pinks, and informed her of information above. Delia and family to discuss next residential placement option and contact CSW with choice. Family is aware that pt is ready for discharge. CSW to continue to follow and assist with discharge planning needs.  Update: CSW received call from pt's family stating that they only wanted [facility] for pt's residential hospice facility placement. Per pt's family, as [facility] is at full capacity, family would like to have pt discharged home with hospice. CSW consulted MD regarding information above, MD recommended CSW also consult Palliative MD as pt still requiring medical needs. CSW has paged Palliative MD. Palliative MD to evaluate pt at bedside and return CSW call with recommendation.  Pati Gallo, [place] Social Worker [phone]

## 2013-07-28 NOTE — Progress Notes (Signed)
Patient WN:[id number] [name]      DOB: [birth year]      QIH:[id number]   Palliative Medicine Team at [facility] Progress Note    Subjective: Patient just medicated for pain.  Monique states she has had more generalized pain today.  She is not eating much at all .  She has not had any further documented seizures.  Family at the bedside, asked about taking her home with hospice since their is no bed at [facility].  My only concern is that she will be very likely to reseize off both antiepileptic medications.     Filed Vitals:   07/28/13 0915  BP: 166/93  Pulse: 85  Temp: 97.4 F (36.3 C)  Resp: 18   Physical exam:  GeneraL: will awaken to gentle verbal stimuli but falls back to sleep easily PERRL, mm dry Chest decreased but clear anteriorly CVS: regular S1, S2 Abd: soft, no grimace Ext warm,no mottling Neuro; encephalopathic due to medicationsl      Assessment and plan:80 yr old with metastatic breast cancer causing seizures.  Patient is not eating and is having more generalized pain. Reviewed by concerns about taking family home and the potential for return of seizures if meds stopped. They are willing to look at other options.  1.  DNR  2.  Seizures: continue meds IV might be able to discontinue one drug in favor of scheduled ativan SL.   3.  Comfort feeding.  Total time 25 min  Monique Rau L. Lovena Le, MD MBA The Palliative Medicine Team at [facility] Phone: [phone] Pager: [phone]

## 2013-07-28 NOTE — Progress Notes (Signed)
MEDICATION RELATED CONSULT NOTE - FOLLOW UP   Pharmacy Consult:  Dilantin Indication:  Seizure  No Known Allergies  Patient Measurements: Height: 5' 4.96" (165 cm) Weight: 162 lb 7.7 oz (73.7 kg) IBW/kg (Calculated) : 56.91  Vital Signs: Temp: 97.6 F (36.4 C) (03/09 0553) Temp src: Axillary (03/09 0553) BP: 140/55 mmHg (03/09 0553) Pulse Rate: 77 (03/09 0553)  Labs:  Recent Labs  07/25/13 2143 07/25/13 2150 07/28/13 0430  WBC 6.6  --   --   HGB 14.8 17.0*  --   HCT 45.5 50.0*  --   PLT 517*  --   --   CREATININE  --  1.50* 0.99  ALBUMIN  --   --  2.8*  PROT  --   --  5.5*  AST  --   --  30  ALT  --   --  13  ALKPHOS  --   --  63  BILITOT  --   --  0.3   Estimated Creatinine Clearance: 37.9 ml/min (by C-G formula based on Cr of 0.99).   Microbiology: No results found for this or any previous visit (from the past 720 hour(s)).     Assessment: 63 YOF with history of breast cancer that metastasized to the brain.  Patient presented to the ED on 07/25/13 with seizure and received Dilantin 1400mg  IV x 1 load followed by 100mg  IV Q8H.  Level post load was adequate/elevated and repeat level today is supra-therapeutic at 24.6 mcg/mL.  Level is not yet at steady-state and may reflect residual from loading load.  No new seizure reported and no symptom of toxicity reported.  Aware request for limiting blood draw.  3/6 DPH 1400mg  IV x 1, then 100mg  IV q8h 3/7 DPH = 27.6, albumin 2.8 >> corrected to 41.8 mcg/mL 3/8 DPH = 17.4 >> 26.4 mcg/mL   Goal of Therapy:  Corrected DPH level:   10-20 mcg/mL   Plan:  - Continue DPH 100mg  IV Q8H for now - Repeat level this week and will decrease dose should level remain supra-therapeutic - Monitor for seizure activity, s/sx of DPH toxicity (nystagmus, drowsiness, dizziness, lethargy, vomiting, slurred speech, blurred vision, etc.) - F/U K+ supplementation, VTE px    Janson Lamar D. Mina Marble, PharmD, BCPS Pager:  [phone] 07/28/2013, 9:48  AM

## 2013-07-28 NOTE — Progress Notes (Signed)
   CARE MANAGEMENT NOTE 07/28/2013  Patient:  [name], [name]   Account Number:  [id number]  Date Initiated:  07/28/2013  Documentation initiated by:  Olga Coaster  Subjective/Objective Assessment:   ADMITTED WITH SEIZURES, BREAST CANCER WITH BRAIN METS     Action/Plan:   CM FOLLOWING FOR DCP   Anticipated DC Date:  08/04/2013   Anticipated DC Plan:  [facility]  In-house referral  Clinical Social Worker      DC Planning Services  CM consult         Status of service:  In process, will continue to follow Medicare Important Message given?   (If response is "NO", the following Medicare IM given date fields will be blank)  Per UR Regulation:  Reviewed for med. necessity/level of care/duration of stay  Comments:  3/9/2015Mindi Slicker RN,BSN,MHA [phone]

## 2013-07-28 NOTE — Progress Notes (Signed)
PROGRESS NOTE  TYA HAUGHEY RSW:[id number] DOB: 1921/04/20 DOA: 07/25/2013 PCP: Elyn Peers, MD  Assessment/Plan: Status epilepticus - 5 hour duration -patient has very poor prognosis of recovery after 5 hour seizure down time, though we will give her a chance to do so- IV seizure meds -palliative care consultation- comfort care -PICC line -asked for SLP eval as patient is more awake than at admission -ativan PRN -[facility]- full and family wanted to take patient home- DO NOT think they can manage her IV meds at home  metastatic cancer to brain H/o breast cancer  Concern for aspiration - patient with nasal trumpet in place, intubation contraindicated by patients wishes, will monitor for development of PNA.       Code Status: DNR Family Communication: family at bedside Disposition Plan: Hospice   Consultants:  Neuro  Palliative care  Procedures:    Antibiotics:    HPI/Subjective: sleeping  Objective: Filed Vitals:   07/28/13 0915  BP: 166/93  Pulse: 85  Temp: 97.4 F (36.3 C)  Resp: 18    Intake/Output Summary (Last 24 hours) at 07/28/13 1359 Last data filed at 07/28/13 1000  Gross per 24 hour  Intake      0 ml  Output      0 ml  Net      0 ml   Filed Weights   07/25/13 2041  Weight: 73.7 kg (162 lb 7.7 oz)    Exam:   General:  sleeping  Cardiovascular: rrr  Respiratory: clear anterior  Abdomen: +BS, soft  Musculoskeletal: not following commands, no edema  Data Reviewed: Basic Metabolic Panel:  Recent Labs Lab 07/23/13 1530 07/23/13 1900 07/24/13 0315 07/25/13 2150 07/28/13 0430  NA 138  --  138 140 145  K 5.0  --  4.2 4.0 3.6*  CL 100  --  103 104 108  CO2 22  --  19  --  23  GLUCOSE 100*  --  99 190* 65*  BUN 20  --  16 17 9   CREATININE 1.31*  --  1.18* 1.50* 0.99  CALCIUM 10.3  --  9.2  --  8.5  MG  --  1.5  --   --   --   PHOS  --  2.8  --   --   --    Liver Function Tests:  Recent Labs Lab  07/23/13 1530 07/28/13 0430  AST 30 30  ALT 6 13  ALKPHOS 71 63  BILITOT 0.7 0.3  PROT 7.3 5.5*  ALBUMIN 3.7 2.8*   No results found for this basename: LIPASE, AMYLASE,  in the last 168 hours No results found for this basename: AMMONIA,  in the last 168 hours CBC:  Recent Labs Lab 07/23/13 1530 07/24/13 0315 07/25/13 2143 07/25/13 2150  WBC 7.1 5.9 6.6  --   NEUTROABS 4.9  --  5.7  --   HGB 15.5* 14.7 14.8 17.0*  HCT 46.7* 45.7 45.5 50.0*  MCV 64.1* 65.0* 65.6*  --   PLT 529* 499* 517*  --    Cardiac Enzymes:  Recent Labs Lab 07/23/13 1530 07/23/13 1900 07/23/13 2330  TROPONINI 0.30* 0.39* 0.32*   BNP (last 3 results) No results found for this basename: PROBNP,  in the last 8760 hours CBG:  Recent Labs Lab 07/23/13 2142 07/25/13 1950  GLUCAP 91 139*    No results found for this or any previous visit (from the past 240 hour(s)).   Studies: No results found.  Scheduled  Meds: . levETIRAcetam  1,000 mg Intravenous Q12H  . phenytoin (DILANTIN) IV  100 mg Intravenous 3 times per day   Continuous Infusions: . sodium chloride 75 mL/hr at 07/26/13 1236   Antibiotics Given (last 72 hours)   None      Principal Problem:   Status epilepticus Active Problems:   Seizure   Refractory complex partial status epilepticus    Time spent: [place], [place] [facility] Pager [phone]. If 7PM-7AM, please contact night-coverage at [email or web address], password [facility] 07/28/2013, 1:59 PM  LOS: 3 days

## 2013-07-29 MED ORDER — SODIUM CHLORIDE 0.9 % IV SOLN: 1000.0000 mg | Freq: Two times a day (BID) | INTRAVENOUS | Status: AC

## 2013-07-29 MED ORDER — LORAZEPAM 2 MG/ML IJ SOLN
0.5000 mg | INTRAMUSCULAR | Status: AC | PRN
Start: 2013-07-29 — End: ?

## 2013-07-29 MED ORDER — PHENYTOIN SODIUM 50 MG/ML IJ SOLN: 100.0000 mg | Freq: Three times a day (TID) | INTRAMUSCULAR | Status: AC

## 2013-07-29 MED ORDER — MORPHINE SULFATE 2 MG/ML IJ SOLN: 0.5000 mg | INTRAMUSCULAR | Status: AC | PRN

## 2013-07-29 NOTE — Clinical Social Work Note (Signed)
CSW spoke with admissions liaison with [facility]) regarding pt's admission to their facility. Per admission liaison, pt will be discharged from [facility] and admitted to [place] on 07/29/2013. Admissions liaison stated that [facility] is currently awaiting pt's medication to arrive to facility prior to authorizing CSW to set-up ambulance transportation. CSW has completed discharge packet and placed on pt's shadow chart. CSW to call for ambulance transportation once given the "okay" from [facility] admissions liaison.  Admissions liaison with [facility] stated that she estimated that the pt's medication will arrive at [facility] around 2:30pm on 07/29/2013.  Admissions liaison to call CSW with report number for RN to call at time of discharge. CSW to update note once number given.  Pati Gallo, [place] Social Worker [phone]

## 2013-07-29 NOTE — Evaluation (Signed)
Clinical/Bedside Swallow Evaluation Patient Details  Name: [name] MRN: [id number] Date of Birth: 13-Apr-1921  Today's Date: 07/29/2013 Time: 4742-5956 SLP Time Calculation (min): 20 min  Past Medical History:  Past Medical History  Diagnosis Date  . Hypertension   . Diabetes mellitus without complication   . Breast cancer     a. Metastatic triple negative breast CA. S/p L lumpectomy/sentinal node bx ~1996. Increasing mass 2014 with subsequent dx triple negative invasive ductal carcinoma with lymphovascular invasion. PET with mets in bone and lung. Pt opted for no tx.  . Lower GI bleed     a. 2007/2012 -  with associated anemia. Suspected diverticular bleed or from her arteriovenous malformations that were seen on prior colonscopy.  . Chronic anemia    Past Surgical History:  Past Surgical History  Procedure Laterality Date  . Cholecystectomy  2011  . Shoulder open rotator cuff repair    . Breast lumpectomy Left     with alnd, followed by xrt   HPI:  80 y.o. female with h/o metastatic breast cancer. She just found out 2 days ago that this had metastasized to brain (she was hospitalized on our service at that time although this was for positive troponins, and positive PET scan for mets, not seizures at this time). At that time she made her wishes clear to family and physicians: no additional aggressive treatment for BRCA, DNI/DNR, no feeding tube, and requested discharge from the hospital. Today at 3 PM, the patient began having seizure activity.  Pt. followed by Palliative services.   Assessment / Plan / Recommendation Clinical Impression  Pt. is clearly in a declined state in overall function and  is reluctant but agreeable for head of bed to approximately 40 degrees due to pain level.  Minimal oral care provided (pt. cooperation).  Family present and educated on swallow precautions including:  attempt po's only if pt. agreeable, elevate head of bed as pt.able, oral care  prior to ice chips, small sips water/thin liquid via straw or bite of puree for pleasure/comfort as pt. desires.  Would not recommend ordering diet for tray 3 times/day, however puree (applesauce/yogurt/pudding/ice cream) thin liquid as desired.  MD would need to order this.  No follow up needed, however if family has questions please reconsult.     Aspiration Risk  Severe    Diet Recommendation  (comfort feeds/puree textures/thin as desired)   Liquid Administration via: Straw Medication Administration:  (IV) Supervision: Full supervision/cueing for compensatory strategies;Trained caregiver to feed patient Compensations: Slow rate;Small sips/bites Postural Changes and/or Swallow Maneuvers:  (seated upright as pt. able)    Other  Recommendations Oral Care Recommendations: Oral care prior to ice chips   Follow Up Recommendations  None    Frequency and Duration        Pertinent Vitals/Pain WDL         Swallow Study         Oral/Motor/Sensory Function Overall Oral Motor/Sensory Function:  (generalized weakness)   Ice Chips Ice chips: Not tested   Thin Liquid Thin Liquid: Impaired Presentation: Straw Oral Phase Impairments: Reduced lingual movement/coordination Pharyngeal  Phase Impairments: Cough - Delayed;Decreased hyoid-laryngeal movement;Suspected delayed Swallow    Nectar Thick Nectar Thick Liquid: Not tested   Honey Thick Honey Thick Liquid: Not tested   Puree Puree: Impaired Oral Phase Impairments: Reduced lingual movement/coordination;Impaired anterior to posterior transit Pharyngeal Phase Impairments: Suspected delayed Swallow;Decreased hyoid-laryngeal movement   Solid       Solid: Not tested  [name] [place].[employer] [phone]  07/29/2013

## 2013-07-29 NOTE — Discharge Summary (Signed)
Physician Discharge Summary  KEYONDRA LAGRAND DUK:[id number] DOB: [birth year] DOA: 07/25/2013  PCP: Elyn Peers, MD  Admit date: 07/25/2013 Discharge date: 07/29/2013  Time spent: 35 minutes  Recommendations for Outpatient Follow-up:  1. To hospice house  Discharge Diagnoses:  Principal Problem:   Status epilepticus Active Problems:   Seizure   Refractory complex partial status epilepticus   Discharge Condition: stable  Diet recommendation: comfort feeds  Filed Weights   07/25/13 2041  Weight: 73.7 kg (162 lb 7.7 oz)    History of present illness:  [name] is a 80 y.o. female with h/o metastatic breast cancer. She just found out 2 days ago that this had metastasized to brain (she was hospitalized on our service at that time although this was for positive troponins, and positive PET scan for mets, not seizures at this time). At that time she made her wishes clear to family and physicians: no additional aggressive treatment for BRCA, DNI/DNR, no feeding tube, and requested discharge from the hospital. Today at 3 PM, the patient began having seizure activity, no return to baseline. Continues to be in status epilepticus some 5 or more hours later in the ED before finally being brought out of it by medications   Hospital Course:  Status epilepticus - 5 hour duration  -very poor prognosis of recovery after 5 hour seizure down time -palliative care consultation- comfort care  -PICC line - will be d/c'd with this -comfort feeds -ativan PRN /morphine PRN  metastatic cancer to brain  H/o breast cancer     Procedures:    Consultations:  Palliative care  neuro  Discharge Exam: Filed Vitals:   07/29/13 0855  BP: 151/91  Pulse: 89  Temp: 97.7 F (36.5 C)  Resp: 18    General: sleeping   Discharge Instructions      Discharge Orders   Future Appointments Provider Department Dept Phone   08/08/2013 10:45 AM [facility] [phone]   08/08/2013 11:15 AM Minette Headland, NP [facility] [phone]   Future Orders Complete By Expires   Discharge instructions  As directed    Comments:     Comfort feeds       Medication List    STOP taking these medications       ALEVE 220 MG tablet  Generic drug:  naproxen sodium     amLODipine 5 MG tablet  Commonly known as:  NORVASC     aspirin EC 81 MG tablet     glucose blood test strip     saxagliptin HCl 5 MG Tabs tablet  Commonly known as:  ONGLYZA      TAKE these medications       LORazepam 2 MG/ML injection  Commonly known as:  ATIVAN  Inject 0.25 mLs (0.5 mg total) into the vein every 4 (four) hours as needed for anxiety.     morphine 2 MG/ML injection  Inject 0.25 mLs (0.5 mg total) into the vein every hour as needed.     phenytoin 50 MG/ML injection  Commonly known as:  DILANTIN  Inject 2 mLs (100 mg total) into the vein every 8 (eight) hours.     sodium chloride 0.9 % SOLN 100 mL with levETIRAcetam 500 MG/5ML SOLN 1,000 mg  Inject 1,000 mg into the vein every 12 (twelve) hours.       No Known Allergies    The results of significant diagnostics from this  hospitalization (including imaging, microbiology, ancillary and laboratory) are listed below for reference.    Significant Diagnostic Studies: Dg Chest 2 View  07/23/2013   CLINICAL DATA:  History of breast cancer.  Altered mental status.  EXAM: CHEST  2 VIEW  COMPARISON:  PA and lateral chest 02/11/2013.  FINDINGS: Innumerable bilateral pulmonary nodules are identified consistent with metastatic disease. There is no pneumothorax or pleural effusion. Heart size is normal. Surgical clips left axilla are noted.  IMPRESSION: Innumerable bilateral pulmonary nodules consistent with metastatic disease.   Electronically Signed   By: Inge Rise M.D.   On: 07/23/2013 16:07   Ct Head Wo Contrast  07/23/2013   CLINICAL DATA:  Altered mental  status and weakness  EXAM: CT HEAD WITHOUT CONTRAST  TECHNIQUE: Contiguous axial images were obtained from the base of the skull through the vertex without intravenous contrast.  COMPARISON:  None.  FINDINGS: There is mild-to-moderate diffuse cerebral and cerebellar atrophy with compensatory ventriculomegaly. There is decreased density in the deep white matter of the right frontal lobe that is asymmetric without on the left and may reflect edema or old ischemic change. There is no mass effect upon the adjacent falx cerebrum. There is mildly decreased density in the deep white matter of both cerebral hemispheres elsewhere consistent with chronic small vessel ischemia. There is no evidence of an acute intracranial hemorrhage.  At bone window settings the observed portions of the paranasal sinuses and mastoid air cells are clear. No lytic or blastic bony lesion is demonstrated and there is no evidence of an acute skull fracture.  IMPRESSION: 1. There is no evidence of an acute intracranial hemorrhage. 2. There is hypodensity in the right frontal deep white matter asymmetric the with that on the left which may reflect edema or encephalomalacia. The possibility of metastatic disease is raised. A follow-up MRI is recommended. 3. There is moderate diffuse cerebral and cerebellar atrophy consistent with chronic small vessel ischemia.   Electronically Signed   By: David  Martinique   On: 07/23/2013 15:41   Mr Brain W Wo Contrast  07/23/2013   CLINICAL DATA:  Metastatic breast cancer.  Abnormal head CT.  EXAM: MRI HEAD WITHOUT AND WITH CONTRAST  TECHNIQUE: Multiplanar, multiecho pulse sequences of the brain and surrounding structures were obtained without and with intravenous contrast.  CONTRAST:  33m MULTIHANCE GADOBENATE DIMEGLUMINE 529 MG/ML IV SOLN  COMPARISON:  CT head from the same day.  FINDINGS: A peripheral lesion in the anterior right frontal lobe measures 11 x 11 x 8 mm. A much smaller lesion in the left caudate  head measures 4 mm. No other focal enhancing lesions are present. There is moderate edema surrounding the anterior right frontal lobe lesion. There is no significant edematous change surrounding the caudate head lesion. Scattered periventricular lesions are worse left than right. The ventricles are of normal size. No significant extra-axial fluid collection is present.  Flow is present in the major intracranial arteries. The globes and orbits are intact. The paranasal sinuses and mastoid air cells are clear.  IMPRESSION: 1. 11 x 11 x 8 mm enhancing lesion in the anterior right frontal lobe is most concerning for a breast cancer metastasis. 2. 4 mm metastasis within the left caudate head. 3. Scattered periventricular white matter disease is worse left than right. This likely reflects the sequelae of chronic microvascular ischemia.   Electronically Signed   By: CLawrence SantiagoM.D.   On: 07/23/2013 20:42    Microbiology: No results found  for this or any previous visit (from the past 240 hour(s)).   Labs: Basic Metabolic Panel:  Recent Labs Lab 07/23/13 1530 07/23/13 1900 07/24/13 0315 07/25/13 2150 07/28/13 0430  NA 138  --  138 140 145  K 5.0  --  4.2 4.0 3.6*  CL 100  --  103 104 108  CO2 22  --  19  --  23  GLUCOSE 100*  --  99 190* 65*  BUN 20  --  16 17 9   CREATININE 1.31*  --  1.18* 1.50* 0.99  CALCIUM 10.3  --  9.2  --  8.5  MG  --  1.5  --   --   --   PHOS  --  2.8  --   --   --    Liver Function Tests:  Recent Labs Lab 07/23/13 1530 07/28/13 0430  AST 30 30  ALT 6 13  ALKPHOS 71 63  BILITOT 0.7 0.3  PROT 7.3 5.5*  ALBUMIN 3.7 2.8*   No results found for this basename: LIPASE, AMYLASE,  in the last 168 hours No results found for this basename: AMMONIA,  in the last 168 hours CBC:  Recent Labs Lab 07/23/13 1530 07/24/13 0315 07/25/13 2143 07/25/13 2150  WBC 7.1 5.9 6.6  --   NEUTROABS 4.9  --  5.7  --   HGB 15.5* 14.7 14.8 17.0*  HCT 46.7* 45.7 45.5 50.0*   MCV 64.1* 65.0* 65.6*  --   PLT 529* 499* 517*  --    Cardiac Enzymes:  Recent Labs Lab 07/23/13 1530 07/23/13 1900 07/23/13 2330  TROPONINI 0.30* 0.39* 0.32*   BNP: BNP (last 3 results) No results found for this basename: PROBNP,  in the last 8760 hours CBG:  Recent Labs Lab 07/23/13 2142 07/25/13 1950  GLUCAP 91 139*       Signed:  Gennett Garcia  [facility] 07/29/2013, 1:07 PM

## 2013-07-29 NOTE — Progress Notes (Signed)
Report given to [facility].

## 2013-07-29 NOTE — Progress Notes (Signed)
Per Dr. Eliseo Squires- patient is medically ready for d/c today.  CSW notified that [facility] does not have any vacancies at this time;  Received call from [place] for [facility]. They can offer a bed to patient today. CSW discussed with granddaughter Delia and grandson- Edd Arbour that patient is medically stable per MD and they are accepting of bed at [facility].  Beverlee Nims will call family to arrange to sign papers for Hospice, then will arrange EMS.  Patient's grandson was very appreciative of all staff's efforts to arrange d/c plan for patient.  Lorie Phenix. [place], [place]

## 2013-08-07 ENCOUNTER — Other Ambulatory Visit: Payer: Self-pay

## 2013-08-07 DIAGNOSIS — C50119 Malignant neoplasm of central portion of unspecified female breast: Secondary | ICD-10-CM

## 2013-08-08 ENCOUNTER — Ambulatory Visit: Payer: PRIVATE HEALTH INSURANCE | Admitting: Adult Health

## 2013-08-08 ENCOUNTER — Other Ambulatory Visit: Payer: PRIVATE HEALTH INSURANCE

## 2013-08-20 DEATH — deceased
